# Patient Record
Sex: Male | Born: 1955 | Race: White | Hispanic: No | Marital: Married | Smoking: Never smoker
Health system: Southern US, Community
[De-identification: ages and names within clinical notes are randomized; demographics above are authoritative.]

## PROBLEM LIST (undated history)

## (undated) HISTORY — PX: NO PAST SURGERIES: SHX2092

---

## 2006-01-29 ENCOUNTER — Ambulatory Visit: Payer: Self-pay | Admitting: Family Medicine

## 2006-05-20 ENCOUNTER — Ambulatory Visit: Payer: Self-pay | Admitting: Family Medicine

## 2006-05-20 DIAGNOSIS — I10 Essential (primary) hypertension: Secondary | ICD-10-CM

## 2006-05-20 DIAGNOSIS — R000 Tachycardia, unspecified: Secondary | ICD-10-CM | POA: Insufficient documentation

## 2006-05-22 ENCOUNTER — Telehealth (INDEPENDENT_AMBULATORY_CARE_PROVIDER_SITE_OTHER): Payer: Self-pay | Admitting: *Deleted

## 2006-06-08 ENCOUNTER — Encounter: Payer: Self-pay | Admitting: Family Medicine

## 2006-06-11 ENCOUNTER — Ambulatory Visit: Payer: Self-pay | Admitting: Family Medicine

## 2006-06-11 ENCOUNTER — Telehealth: Payer: Self-pay | Admitting: Family Medicine

## 2006-06-12 ENCOUNTER — Encounter: Payer: Self-pay | Admitting: Family Medicine

## 2006-06-12 ENCOUNTER — Telehealth (INDEPENDENT_AMBULATORY_CARE_PROVIDER_SITE_OTHER): Payer: Self-pay | Admitting: *Deleted

## 2006-06-12 LAB — CONVERTED CEMR LAB
ALT: 51 units/L (ref 0–53)
AST: 48 units/L — ABNORMAL HIGH (ref 0–37)
Albumin: 4.3 g/dL (ref 3.5–5.2)
Alkaline Phosphatase: 74 units/L (ref 39–117)
Potassium: 4.4 meq/L (ref 3.5–5.3)
Sodium: 139 meq/L (ref 135–145)
Total Protein: 7.1 g/dL (ref 6.0–8.3)
Uric Acid, Serum: 7.3 mg/dL — ABNORMAL HIGH (ref 2.4–7.0)

## 2006-06-15 LAB — CONVERTED CEMR LAB

## 2006-06-22 ENCOUNTER — Telehealth: Payer: Self-pay | Admitting: Family Medicine

## 2006-06-25 ENCOUNTER — Encounter: Payer: Self-pay | Admitting: Family Medicine

## 2006-12-22 ENCOUNTER — Ambulatory Visit: Payer: Self-pay | Admitting: Family Medicine

## 2006-12-22 DIAGNOSIS — M109 Gout, unspecified: Secondary | ICD-10-CM

## 2006-12-23 ENCOUNTER — Encounter: Payer: Self-pay | Admitting: Family Medicine

## 2006-12-28 ENCOUNTER — Telehealth: Payer: Self-pay | Admitting: Family Medicine

## 2006-12-30 LAB — CONVERTED CEMR LAB
Calcium: 10 mg/dL (ref 8.4–10.5)
Direct LDL: 154 mg/dL — ABNORMAL HIGH
Sodium: 140 meq/L (ref 135–145)

## 2007-03-17 ENCOUNTER — Ambulatory Visit: Payer: Self-pay | Admitting: Cardiology

## 2007-09-29 ENCOUNTER — Ambulatory Visit: Payer: Self-pay | Admitting: Family Medicine

## 2007-09-29 DIAGNOSIS — M25429 Effusion, unspecified elbow: Secondary | ICD-10-CM

## 2007-09-29 DIAGNOSIS — M7020 Olecranon bursitis, unspecified elbow: Secondary | ICD-10-CM

## 2007-11-08 ENCOUNTER — Ambulatory Visit: Payer: Self-pay | Admitting: Family Medicine

## 2007-11-08 DIAGNOSIS — IMO0002 Reserved for concepts with insufficient information to code with codable children: Secondary | ICD-10-CM

## 2007-11-09 ENCOUNTER — Encounter: Payer: Self-pay | Admitting: Family Medicine

## 2008-10-18 ENCOUNTER — Ambulatory Visit: Payer: Self-pay | Admitting: Family Medicine

## 2008-11-07 ENCOUNTER — Telehealth (INDEPENDENT_AMBULATORY_CARE_PROVIDER_SITE_OTHER): Payer: Self-pay | Admitting: *Deleted

## 2008-11-16 ENCOUNTER — Ambulatory Visit: Payer: Self-pay | Admitting: Family Medicine

## 2009-01-15 ENCOUNTER — Ambulatory Visit: Payer: Self-pay | Admitting: Family Medicine

## 2009-01-15 DIAGNOSIS — L02619 Cutaneous abscess of unspecified foot: Secondary | ICD-10-CM

## 2009-01-15 DIAGNOSIS — L03119 Cellulitis of unspecified part of limb: Secondary | ICD-10-CM

## 2009-01-16 ENCOUNTER — Encounter: Payer: Self-pay | Admitting: Family Medicine

## 2009-01-16 ENCOUNTER — Telehealth (INDEPENDENT_AMBULATORY_CARE_PROVIDER_SITE_OTHER): Payer: Self-pay | Admitting: *Deleted

## 2009-01-16 ENCOUNTER — Telehealth: Payer: Self-pay | Admitting: Family Medicine

## 2009-01-16 LAB — CONVERTED CEMR LAB
Basophils Relative: 0 % (ref 0–1)
Lymphs Abs: 1.4 10*3/uL (ref 0.7–4.0)
MCHC: 32.5 g/dL (ref 30.0–36.0)
Monocytes Relative: 7 % (ref 3–12)
Neutro Abs: 7.3 10*3/uL (ref 1.7–7.7)
Neutrophils Relative %: 78 % — ABNORMAL HIGH (ref 43–77)
Platelets: 282 10*3/uL (ref 150–400)
RBC: 4.44 M/uL (ref 4.22–5.81)
Uric Acid, Serum: 6.1 mg/dL (ref 4.0–7.8)
WBC: 9.5 10*3/uL (ref 4.0–10.5)

## 2009-01-19 ENCOUNTER — Encounter: Payer: Self-pay | Admitting: Family Medicine

## 2009-03-07 ENCOUNTER — Telehealth: Payer: Self-pay | Admitting: Family Medicine

## 2009-05-07 ENCOUNTER — Telehealth: Payer: Self-pay | Admitting: Family Medicine

## 2009-05-07 ENCOUNTER — Ambulatory Visit: Payer: Self-pay | Admitting: Family Medicine

## 2009-05-08 ENCOUNTER — Telehealth: Payer: Self-pay | Admitting: Family Medicine

## 2009-05-08 ENCOUNTER — Encounter: Payer: Self-pay | Admitting: Family Medicine

## 2009-05-08 LAB — CONVERTED CEMR LAB
Basophils Absolute: 0.1 10*3/uL (ref 0.0–0.1)
Basophils Relative: 1 % (ref 0–1)
Hemoglobin: 14.9 g/dL (ref 13.0–17.0)
Lymphocytes Relative: 24 % (ref 12–46)
Monocytes Absolute: 0.5 10*3/uL (ref 0.1–1.0)
Neutro Abs: 5 10*3/uL (ref 1.7–7.7)
Neutrophils Relative %: 65 % (ref 43–77)
Platelets: 234 10*3/uL (ref 150–400)
RDW: 13 % (ref 11.5–15.5)
Sed Rate: 8 mm/hr (ref 0–16)
Uric Acid, Serum: 8 mg/dL — ABNORMAL HIGH (ref 4.0–7.8)

## 2009-07-16 ENCOUNTER — Encounter: Admission: RE | Admit: 2009-07-16 | Discharge: 2009-07-16 | Payer: Self-pay | Admitting: Family Medicine

## 2009-07-16 ENCOUNTER — Ambulatory Visit: Payer: Self-pay | Admitting: Family Medicine

## 2009-07-16 DIAGNOSIS — M25469 Effusion, unspecified knee: Secondary | ICD-10-CM | POA: Insufficient documentation

## 2009-07-16 DIAGNOSIS — M25569 Pain in unspecified knee: Secondary | ICD-10-CM

## 2009-07-17 LAB — CONVERTED CEMR LAB
Basophils Absolute: 0 10*3/uL (ref 0.0–0.1)
Basophils Relative: 0 % (ref 0–1)
Lymphocytes Relative: 18 % (ref 12–46)
MCHC: 33.6 g/dL (ref 30.0–36.0)
Neutro Abs: 5 10*3/uL (ref 1.7–7.7)
Neutrophils Relative %: 73 % (ref 43–77)
Platelets: 236 10*3/uL (ref 150–400)
RDW: 12.9 % (ref 11.5–15.5)
Sed Rate: 19 mm/hr — ABNORMAL HIGH (ref 0–16)
Uric Acid, Serum: 6.4 mg/dL (ref 4.0–7.8)

## 2009-07-25 ENCOUNTER — Ambulatory Visit: Payer: Self-pay | Admitting: Family Medicine

## 2009-08-14 ENCOUNTER — Ambulatory Visit: Payer: Self-pay | Admitting: Family Medicine

## 2009-08-15 LAB — CONVERTED CEMR LAB
BUN: 15 mg/dL (ref 6–23)
Basophils Absolute: 0.1 10*3/uL (ref 0.0–0.1)
Basophils Relative: 1 % (ref 0–1)
Calcium: 9.1 mg/dL (ref 8.4–10.5)
Eosinophils Relative: 1 % (ref 0–5)
Glucose, Bld: 100 mg/dL — ABNORMAL HIGH (ref 70–99)
Hemoglobin: 14.5 g/dL (ref 13.0–17.0)
Lymphocytes Relative: 21 % (ref 12–46)
MCHC: 33.6 g/dL (ref 30.0–36.0)
Monocytes Absolute: 0.7 10*3/uL (ref 0.1–1.0)
Neutro Abs: 5.2 10*3/uL (ref 1.7–7.7)
Platelets: 278 10*3/uL (ref 150–400)
Potassium: 4.2 meq/L (ref 3.5–5.3)
RDW: 12.3 % (ref 11.5–15.5)
Sodium: 135 meq/L (ref 135–145)
Uric Acid, Serum: 6.3 mg/dL (ref 4.0–7.8)

## 2009-08-16 ENCOUNTER — Telehealth: Payer: Self-pay | Admitting: Family Medicine

## 2009-08-24 ENCOUNTER — Telehealth (INDEPENDENT_AMBULATORY_CARE_PROVIDER_SITE_OTHER): Payer: Self-pay | Admitting: *Deleted

## 2010-04-13 ENCOUNTER — Ambulatory Visit: Payer: Self-pay | Admitting: Emergency Medicine

## 2010-04-13 DIAGNOSIS — J029 Acute pharyngitis, unspecified: Secondary | ICD-10-CM | POA: Insufficient documentation

## 2010-04-13 LAB — CONVERTED CEMR LAB: Rapid Strep: NEGATIVE

## 2010-07-23 NOTE — Letter (Signed)
Summary: CONTROLLED MED RX POLICY  CONTROLLED MED RX POLICY   Imported By: Dannette Barbara 04/14/2010 15:05:07  _____________________________________________________________________  External Attachment:    Type:   Image     Comment:   External Document

## 2010-07-23 NOTE — Progress Notes (Signed)
Summary: Pain  Phone Note Call from Patient   Caller: Patient Summary of Call: Pt's daughter Marko Plume)  [facility] asking for a return call from you to discuss Pt's foot. I called Brooke back to tell her that she was not listed and we were not able to release information to her. She stated that Pt was very frustrated bc he feels that he has wasted so much time in our office and other offices and has not gotten any relief or answers. Nehemiah Settle said that Pt's foot was much worse today and Pt could not walk. I advised her to have Pt go to ED tonight. She agreed and was on her way to Pt's house.  Initial call taken by: Payton Spark CMA,  August 16, 2009 4:47 PM

## 2010-07-23 NOTE — Assessment & Plan Note (Signed)
Summary: foot pain/ gout   Vital Signs:  Patient profile:   55 year old male Height:      68.5 inches Weight:      211 pounds BMI:     31.73 O2 Sat:      97 % on Room air Temp:     98.4 degrees F oral Pulse rate:   82 / minute BP sitting:   154 / 94  (left arm) Cuff size:   large  Vitals Entered By: Payton Spark CMA (August 14, 2009 2:33 PM)  O2 Flow:  Room air CC: L foot swollen and painful x 4 days. Does not think it is gout   Primary Care Provider:  Seymour Bars D.O.  CC:  L foot swollen and painful x 4 days. Does not think it is gout.  History of Present Illness: 55 yo WM presents for bilat foot pain/ redness and L sided swelling x 4 days.  Hx of severe gout, requiring hospitalization for cellulitis in 2010.  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  He is currently on Uloric + Indomethacin for his gout.  He is due to have his uric acid checked.  His pain has increased and involves the L leg up to the knee with swelling.  He has been adding more Advil to his current regimen.    He is not on BP meds. Denies fevers.    Allergies: No Known Drug Allergies  Past History:  Past Medical History: Reviewed history from 09/29/2007 and no changes required. HTN gout  Social History: Reviewed history from 09/29/2007 and no changes required. 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  Has 2 grown sons.  Nonsmoker.  Has 2 beers per day.  Runs 5 miles 6 days a wk. Lifts weights and golfs.  Review of Systems      See HPI  Physical Exam  General:  alert, well-developed, well-nourished, and well-hydrated.   Lungs:  Normal respiratory effort, chest expands symmetrically. Lungs are clear to auscultation, no crackles or wheezes. Heart:  Normal rate and regular rhythm. S1 and S2 normal without gallop, murmur, click, rub or other extra sounds. Msk:  hallux rigiditus both great toes with MTP synovitis. Injection of the  entire L foot w/ no pinpoint tenderness.  full ankle ROM.   Pulses:  2+ bilat radial pulses Extremities:  L>R LE edema Neurologic:  sensation intact to light touch.   antalgic gait   Impression & Recommendations:  Problem # 1:  CELLULITIS AND ABSCESS OF FOOT EXCEPT TOES (ICD-682.7) Will go ahead and put him on Doxy to cover for MRSA given hx of similar acute gout that resulted in fevers and redness.  His pain and redness is worse than his usual flare ups. Call if redness and pain have not improved in 10 days.  Consider TEE to r/o cardiac valve vegetations. The following medications were removed from the medication list:    Cephalexin 500 Mg Tabs (Cephalexin) .Marland Kitchen... 1 tab by mouth three times a day x 7 days His updated medication list for this problem includes:    Doxycycline Monohydrate 100 Mg Caps (Doxycycline monohydrate) .Marland Kitchen... 1 capsule by mouth two times a day x 10 days, take with food  Orders: T-CBC w/Diff ([id number])  Problem # 2:  GOUT NOS (ICD-274.9) Continue current meds.  Check uric acid level today. His updated medication list for this problem includes:    Indomethacin 50 Mg  Caps (Indomethacin) .Marland Kitchen... 1 capsule by mouth three times a day with food    Uloric 40 Mg Tabs (Febuxostat) .Marland Kitchen... 1 tab by mouth daily  Orders: T-Uric Acid (Blood) ([id number])  Problem # 3:  HYPERTENSION, BENIGN ESSENTIAL (ICD-401.1) BP remains high.  Start on Enalapril daily.  F/U in 6 wks. His updated medication list for this problem includes:    Enalapril Maleate 5 Mg Tabs (Enalapril maleate) .Marland Kitchen... 1 tab by mouth daily  Orders: T-Basic Metabolic Panel ([id number])  BP today: 154/94 Prior BP: 182/103 (07/16/2009)  Labs Reviewed: K+: 4.9 (12/22/2006) Creat: : 0.92 (12/22/2006)   Chol: 232 (06/11/2006)   HDL: 41 (06/11/2006)   LDL: See Comment mg/dL (95/18/8416)   TG: 606 (06/11/2006)  Complete Medication List: 1)  Indomethacin 50 Mg Caps (Indomethacin) .Marland Kitchen.. 1 capsule by mouth three  times a day with food 2)  Uloric 40 Mg Tabs (Febuxostat) .Marland Kitchen.. 1 tab by mouth daily 3)  Percocet 5-325 Mg Tabs (Oxycodone-acetaminophen) .Marland Kitchen.. 1-2 tabs by mouth q hs as needed severe pain 4)  Doxycycline Monohydrate 100 Mg Caps (Doxycycline monohydrate) .Marland Kitchen.. 1 capsule by mouth two times a day x 10 days, take with food 5)  Enalapril Maleate 5 Mg Tabs (Enalapril maleate) .Marland Kitchen.. 1 tab by mouth daily  Patient Instructions: 1)  Start Doxycyline today. 2)  Take for 10 days, with food. 3)  Stay on Uloric + Indomethacin.  Do not combine with Advil. 4)  Start Enalapril once daily for high BP -- it has remained high every visit and it's time to treat. 5)  Call if redness/ swelling / pain has not improved in 10 days and will refer to podiatry if not improving. 6)  Labs today. 7)  Will call you w/ results tomorrow. Prescriptions: ENALAPRIL MALEATE 5 MG TABS (ENALAPRIL MALEATE) 1 tab by mouth daily  #30 x 2   Entered and Authorized by:   Seymour Bars DO   Signed by:   Seymour Bars DO on 08/14/2009   Method used:   [facility] to        [facility] [phone]* (retail)       [address] [place], [place]  [address]       Ph: [phone] or [phone]       Fax: [phone]   RxID:   [id number] ULORIC 40 MG TABS (FEBUXOSTAT) 1 tab by mouth daily  #30 x 0   Entered and Authorized by:   Seymour Bars DO   Signed by:   Seymour Bars DO on 08/14/2009   Method used:   [facility] to        [facility] [phone]* (retail)       [address] [place], [place]  [address]       Ph: [phone] or [phone]       Fax: [phone]   RxID:   [id number] INDOMETHACIN 50 MG CAPS (INDOMETHACIN) 1 capsule by mouth three times a day with food  #90 x 2   Entered and Authorized by:   Seymour Bars DO   Signed by:   Seymour Bars DO on 08/14/2009   Method used:   [facility] to        [facility] [phone]* (retail)       [address] [place], [place]  [address]       Ph: [phone]  or [phone]       Fax: [phone]   RxID:   [id number] DOXYCYCLINE MONOHYDRATE 100 MG CAPS (DOXYCYCLINE MONOHYDRATE) 1 capsule by mouth two times a day x 10 days, take with food  #20 x 0   Entered and Authorized by:   Seymour Bars DO   Signed by:   Seymour Bars DO on 08/14/2009   Method used:   [facility] to        [facility] [phone]* (retail)       [address] [place], [place]  [address]       Ph: [phone] or [phone]       Fax: [phone]   RxID:   [id number]

## 2010-07-23 NOTE — Progress Notes (Signed)
Summary: Requests call  Phone Note Call from Patient   Caller: Patient [phone] Summary of Call: Pt [facility] requesting call for advise on foot problem. Do you want me to call Pt or would you like to call? Pt can be reached at [phone] Initial call taken by: Payton Spark CMA,  August 24, 2009 11:49 AM  Follow-up for Phone Call        call pt to find out what his ? is. Follow-up by: Seymour Bars DO,  August 24, 2009 11:56 AM  Additional Follow-up for Phone Call Additional follow up Details #1::        Pt asked for records and I agreed to leave at front desk. Additional Follow-up by: Payton Spark CMA,  August 24, 2009 1:39 PM

## 2010-07-23 NOTE — Assessment & Plan Note (Signed)
Summary: gouty flare   Vital Signs:  Patient profile:   55 year old male Height:      68.5 inches Weight:      214 pounds BMI:     32.18 O2 Sat:      99 % on Room air Temp:     97.8 degrees F oral Pulse rate:   88 / minute BP sitting:   182 / 103  (left arm) Cuff size:   large  Vitals Entered By: Payton Spark CMA (July 16, 2009 10:08 AM)  O2 Flow:  Room air CC: R knee pain x 1 week.    Primary Care Provider:  Seymour Bars D.O.  CC:  R knee pain x 1 week. Marland Kitchen  History of Present Illness: 55 yo WM presents for R leg pain and swelling mostly localized to the R knee.  He is taking Ibuprofen 600 mg three times a day which is helping some.  Having a hard time sleeping at night.  No fevers.   He has not run in 5 days.    He has a hx of severe gout, requiring hospitalization for foot flare up last year.  He has been taking Ibuprofen or Indocin.  He is on Allopurinol but his last Uric Acid level was 8.     Current Medications (verified): 1)  Indomethacin 50 Mg Caps (Indomethacin) .Marland Kitchen.. 1 Capsule By Mouth Three Times A Day With Food 2)  Allopurinol 100 Mg Tabs (Allopurinol) .Marland Kitchen.. 1 Tab By Mouth Daily 3)  Percocet 5-325 Mg Tabs (Oxycodone-Acetaminophen) .Marland Kitchen.. 1-2 Tabs By Mouth Q 6 Hrs As Needed Severe Pain  Allergies (verified): No Known Drug Allergies  Past History:  Past Medical History: Reviewed history from 09/29/2007 and no changes required. HTN gout  Social History: Reviewed history from 09/29/2007 and no changes required. 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  Nonsmoker.  Has 2 beers per day.  Runs 5 miles 6 days a wk. Lifts weights and golfs.  Review of Systems      See HPI  Physical Exam  General:  alert, well-developed, well-nourished, and well-hydrated.   Head:  normocephalic and atraumatic.   Mouth:  good dentition and pharynx pink and moist.   Neck:  no masses.   Lungs:  Normal respiratory effort, chest expands  symmetrically. Lungs are clear to auscultation, no crackles or wheezes. Heart:  Normal rate and regular rhythm. S1 and S2 normal without gallop, murmur, click, rub or other extra sounds. Msk:  R knee effusion with active flexion to 90 deg and full extension.  tender with movement of the patella.  full passive hip ROM.  soft tissue edema overlying the calf and thigh.  No palpable cords or venous thrombosis present. Pulses:  2+ popliteal pulse (R) Extremities:  R>L LE edema Skin:  sclera non icteric injection and heat overlying the R knee Cervical Nodes:  No lymphadenopathy noted Psych:  good eye contact, not anxious appearing, and not depressed appearing.     Impression & Recommendations:  Problem # 1:  JOINT EFFUSION, KNEE (ICD-719.06) Assessment New Xray today to look for joint degeneration.  No hx of trauma.  Hx of severe gout.  Concern last time for septic joint with fevers.  Check CBC to look for elevated WBC count.  Consider aspiration with ortho.  Cover with Keflex and treatment of his gout. Orders: T-CBC w/Diff ([id number])  Problem # 2:  GOUT NOS (ICD-274.9) Last uric  acid level 8.  He wants to try Uloric instead of Allopurinol.  R knee symptoms likely to be gout.  Stay on NSAID daily.  Check Uric Acid and SED rate today.   His updated medication list for this problem includes:    Indomethacin 50 Mg Caps (Indomethacin) .Marland Kitchen... 1 capsule by mouth three times a day with food    Uloric 40 Mg Tabs (Febuxostat) .Marland Kitchen... 1 tab by mouth daily  Orders: T-Uric Acid (Blood) ([id number]) T-Sed Rate (Automated) ([id number])  Problem # 3:  HYPERTENSION, BENIGN ESSENTIAL (ICD-401.1) BP high today, in pain.  Will update labs are f/u visit.  Will treat his pain and see if his BP normalizes by next wk. BP today: 182/103 Prior BP: 150/94 (05/07/2009)  Labs Reviewed: K+: 4.9 (12/22/2006) Creat: : 0.92 (12/22/2006)   Chol: 232 (06/11/2006)   HDL: 41 (06/11/2006)   LDL: See Comment mg/dL  (16/06/930)   TG: 355 (06/11/2006)  Complete Medication List: 1)  Indomethacin 50 Mg Caps (Indomethacin) .Marland Kitchen.. 1 capsule by mouth three times a day with food 2)  Uloric 40 Mg Tabs (Febuxostat) .Marland Kitchen.. 1 tab by mouth daily 3)  Percocet 5-325 Mg Tabs (Oxycodone-acetaminophen) .Marland Kitchen.. 1-2 tabs by mouth q hs as needed severe pain 4)  Cephalexin 500 Mg Tabs (Cephalexin) .Marland Kitchen.. 1 tab by mouth three times a day x 7 days  Other Orders: T-DG Knee 2 Views*R* ([id number])  Patient Instructions: 1)  Xray R knee today. 2)  Labs today. 3)  Will call you w/ results tomorrow. 4)  Change Allopurinol to Uloric once daily for gout. 5)  Stay on Indomethacin 2-3 x a day with food.   6)  Use Percocet at night as needed. 7)  Empirically start Cephalexin (antibiotic). 8)  REturn to recheck leg in 1 wk. Prescriptions: CEPHALEXIN 500 MG TABS (CEPHALEXIN) 1 tab by mouth three times a day x 7 days  #21 x 0   Entered and Authorized by:   Seymour Bars DO   Signed by:   Seymour Bars DO on 07/16/2009   Method used:   [facility] to        [facility] [phone]* (retail)       [address] [place], [place]  [address]       Ph: [phone] or [phone]       Fax: [phone]   RxID:   [id number] PERCOCET 5-325 MG TABS (OXYCODONE-ACETAMINOPHEN) 1-2 tabs by mouth q hs as needed severe pain  #30 x 0   Entered and Authorized by:   Seymour Bars DO   Signed by:   Seymour Bars DO on 07/16/2009   Method used:   Print then Give to Patient   RxID:   [id number] INDOMETHACIN 50 MG CAPS (INDOMETHACIN) 1 capsule by mouth three times a day with food  #90 x 1   Entered and Authorized by:   Seymour Bars DO   Signed by:   Seymour Bars DO on 07/16/2009   Method used:   [facility]ronically to        [facility] [phone]* (retail)       [address] [place], [place]  [address]       Ph: [phone] or [phone]       Fax: [phone]   RxID:   [id number]

## 2010-07-23 NOTE — Assessment & Plan Note (Signed)
Summary: Cough-dry, chest congestion, sorethroat x 3 dys rm 4   Vital Signs:  Patient Profile:   55 Years Old Male CC:      Cold & URI symptoms Height:     68.5 inches Weight:      211 pounds O2 Sat:      98 % O2 treatment:    Room Air Temp:     97.8 degrees F oral Pulse rate:   82 / minute Pulse rhythm:   regular Resp:     16 per minute BP sitting:   171 / 109  (left arm) Cuff size:   regular  Vitals Entered By: Areta Haber CMA (April 13, 2010 5:38 PM)                  Current Allergies: No known allergies History of Present Illness Chief Complaint: Cold & URI symptoms History of Present Illness: Patient complains of onset of cold symptoms for 4 days.  They have been using Dayquil, Nyquil, Advil, and Zinc which is helping a little bit.  His daughter was visiting last week and was diagnosed and treated for strep throat.   + sore throat + dry cough No pleuritic pain No wheezing No nasal congestion No post-nasal drainage No sinus pain/pressure No itchy/red eyes No earache No hemoptysis No SOB + chills/sweats No fever No nausea No vomiting No abdominal pain No diarrhea No skin rashes No fatigue No myalgias No headache   Current Problems: ACUTE PHARYNGITIS (ICD-462) JOINT EFFUSION, KNEE (ICD-719.06) KNEE PAIN, RIGHT (ICD-719.46) CELLULITIS AND ABSCESS OF FOOT EXCEPT TOES (ICD-682.7) CELLULITIS AND ABSCESS OF UNSPECIFIED DIGIT (ICD-681.9) OLECRANON BURSITIS, RIGHT (ICD-726.33) EFFUSION OF UPPER ARM JOINT (ICD-719.02) GOUT NOS (ICD-274.9) SCREENING FOR MALIGNANT NEOPLASM, PROSTATE (ICD-V76.44) HYPERTENSION, BENIGN ESSENTIAL (ICD-401.1) TACHYCARDIA (ICD-785.0)   Current Meds INDOMETHACIN 50 MG CAPS (INDOMETHACIN) 1 capsule by mouth three times a day with food ULORIC 80 MG TABS (FEBUXOSTAT) 1 tab by mouth daily PERCOCET 5-325 MG TABS (OXYCODONE-ACETAMINOPHEN) 1-2 tabs by mouth q hs as needed severe pain DOXYCYCLINE MONOHYDRATE 100 MG CAPS  (DOXYCYCLINE MONOHYDRATE) 1 capsule by mouth two times a day x 10 days, take with food ENALAPRIL MALEATE 5 MG TABS (ENALAPRIL MALEATE) 1 tab by mouth daily DAYQUIL MULTI-SYMPTOM 30-325-10 MG/15ML LIQD (PSEUDOEPHEDRINE-APAP-DM) as  directed VICKS NYQUIL MULTI-SYMPTOM 15-6.25-500 MG/15ML LIQD (DM-DOXYLAMINE-ACETAMINOPHEN) as directed AMOXICILLIN 875 MG TABS (AMOXICILLIN) 1 tab by mouth two times a day for 7 days CHERATUSSIN AC 100-10 MG/5ML SYRP (GUAIFENESIN-CODEINE) 5cc by mouth every 6 hours as needed for cough  REVIEW OF SYSTEMS Constitutional Symptoms      Denies fever, chills, night sweats, weight loss, weight gain, and fatigue.  Eyes       Denies change in vision, eye pain, eye discharge, glasses, contact lenses, and eye surgery. Ear/Nose/Throat/Mouth       Complains of sore throat and hoarseness.      Denies hearing loss/aids, change in hearing, ear pain, ear discharge, dizziness, frequent runny nose, frequent nose bleeds, sinus problems, and tooth pain or bleeding.      Comments: x 3 dys Respiratory       Complains of dry cough.      Denies productive cough, wheezing, shortness of breath, asthma, bronchitis, and emphysema/COPD.      Comments: chest congestion Cardiovascular       Denies murmurs, chest pain, and tires easily with exhertion.    Gastrointestinal       Denies stomach pain, nausea/vomiting, diarrhea, constipation, blood in bowel movements, and  indigestion. Genitourniary       Denies painful urination, kidney stones, and loss of urinary control. Neurological       Denies paralysis, seizures, and fainting/blackouts. Musculoskeletal       Denies muscle pain, joint pain, joint stiffness, decreased range of motion, redness, swelling, muscle weakness, and gout.  Skin       Denies bruising, unusual mles/lumps or sores, and hair/skin or nail changes.  Psych       Denies mood changes, temper/anger issues, anxiety/stress, speech problems, depression, and sleep problems. Other  Comments: Pt has not seen his PCP for this.   Past History:  Past Medical History: Last updated: 09/29/2007 HTN gout  Past Surgical History: Last updated: 03/31/2006 _  Family History: Last updated: 03/31/2006 _  Social History: Last updated: 09/29/2007 Sr account Production designer, theatre/television/film for [employer].  Married to [place].  Has 2 grown sons.  Nonsmoker.  Has 2 beers per day.  Runs 5 miles 6 days a wk. Lifts weights and golfs.  Risk Factors: Exercise: yes (05/20/2006)  Risk Factors: Smoking Status: never (05/20/2006) Physical Exam General appearance: well developed, well nourished, no acute distress Ears: normal, no lesions or deformities Nasal: mucosa pink, nonedematous, no septal deviation, turbinates normal Oral/Pharynx: pharyngeal erythema without exudate, uvula midline without deviation Chest/Lungs: no rales, wheezes, or rhonchi bilateral, breath sounds equal without effort Heart: regular rate and  rhythm, no murmur Skin: no obvious rashes or lesions MSE: oriented to time, place, and person Assessment New Problems: ACUTE PHARYNGITIS (ICD-462)   Patient Education: Patient and/or caregiver instructed in the following: rest, fluids, Ibuprofen prn. change toothbrush in 1-2 days  Plan New Medications/Changes: CHERATUSSIN AC 100-10 MG/5ML SYRP (GUAIFENESIN-CODEINE) 5cc by mouth every 6 hours as needed for cough  #6oz x 0, 04/13/2010, Hoyt Koch MD AMOXICILLIN 875 MG TABS (AMOXICILLIN) 1 tab by mouth two times a day for 7 days  #14 x 0, 04/13/2010, Hoyt Koch MD  New Orders: New Patient Level III [95621] Rapid Strep [30865] Planning Comments:   1)  Take the prescribed antibiotic as instructed. 2)  Use nasal saline solution (over the counter) at least 3 times a day. 3)  Can take tylenol every 6 hours or motrin every 8 hours for pain or fever. 4)  Follow up with your primary doctor  if no improvement in 5-7 days, sooner if increasing pain, fever, or new  symptoms.  5) Please follow up with your PCP to discuss your blood pressure   The patient and/or caregiver has been counseled thoroughly with regard to medications prescribed including dosage, schedule, interactions, rationale for use, and possible side effects and they verbalize understanding.  Diagnoses and expected course of recovery discussed and will return if not improved as expected or if the condition worsens. Patient and/or caregiver verbalized understanding.  Prescriptions: CHERATUSSIN AC 100-10 MG/5ML SYRP (GUAIFENESIN-CODEINE) 5cc by mouth every 6 hours as needed for cough  #6oz x 0   Entered and Authorized by:   Hoyt Koch MD   Signed by:   Hoyt Koch MD on 04/13/2010   Method used:   Print then Give to Patient   RxID:   [id number] AMOXICILLIN 875 MG TABS (AMOXICILLIN) 1 tab by mouth two times a day for 7 days  #14 x 0   Entered and Authorized by:   Hoyt Koch MD   Signed by:   Hoyt Koch MD on 04/13/2010   Method used:   Print then Give to Patient   RxID:  [id number]   Orders Added: 1)  New Patient Level III [99203] 2)  Rapid Strep [78469]    Laboratory Results  Date/Time Received: April 13, 2010 6:01 PM  Date/Time Reported: April 13, 2010 6:01 PM   Other Tests  Rapid Strep: negative  Kit Test Internal QC: Negative   (Normal Range: Negative)

## 2010-08-13 ENCOUNTER — Encounter: Payer: Self-pay | Admitting: Family Medicine

## 2010-08-13 ENCOUNTER — Ambulatory Visit: Payer: Self-pay | Admitting: Family Medicine

## 2010-08-13 ENCOUNTER — Ambulatory Visit (INDEPENDENT_AMBULATORY_CARE_PROVIDER_SITE_OTHER): Payer: Self-pay | Admitting: Family Medicine

## 2010-08-13 DIAGNOSIS — I10 Essential (primary) hypertension: Secondary | ICD-10-CM

## 2010-08-13 DIAGNOSIS — R21 Rash and other nonspecific skin eruption: Secondary | ICD-10-CM | POA: Insufficient documentation

## 2010-08-14 ENCOUNTER — Encounter: Payer: Self-pay | Admitting: Family Medicine

## 2010-08-20 NOTE — Assessment & Plan Note (Signed)
Summary: rash/ BP   Vital Signs:  Patient profile:   55 year old male Height:      68.5 inches Weight:      206 pounds BMI:     30.98 O2 Sat:      98 % on Room air Pulse rate:   94 / minute BP sitting:   155 / 99  (left arm) Cuff size:   large  Vitals Entered By: Payton Spark CMA (August 13, 2010 10:49 AM)  O2 Flow:  Room air CC: C/o psoriasis on head x 6 weeks.   Primary Care Provider:  Seymour Bars D.O.  CC:  C/o psoriasis on head x 6 weeks.Marland Kitchen  History of Present Illness: He states he first noted a rash on his head approximately 6 weeks ago.  When it first appeared, the skin broke, it was pruritic, his scalp was flaking and it slowly started to enlarge.  He started putting 1% hydrocortisone cream on it 4 weeks ago and the flaking went away, and the skin was no longer breaking open.  About this time, he also began to develop redness on the front of his head but the skin never broke open.  He does occasionally put the cream on this part of his head if it gets very red.  Currently, he states it is not pruritic but only red.  He states this has never happened before.  Patient states he takes his blood pressure at home occasionally and its usually upper 130s/upper 80s.  He was on a blood pressure medication at one time but he did not feel like it lowered his blood pressure even though he was on it for approximately one year.  He has had no recent weight changes.  He does state that he takes approximately 6 ibuprofen per day out of habit after running but does not have any pain.  He realizes that he will eventually need to be on a blood pressure medication and is open to starting one today.  He denies chest pains, heart palpitations, dizziness, headaches or changes in vision.  In regards to his foot issues, Dr. Yates Decamp referred him to another physician who placed him on allopurinol and he states that he is doing very well and is able to run every day.  Current Medications  (verified): 1)  Colcrys 0.6 Mg Tabs (Colchicine) 2)  Allopurinol 300 Mg Tabs (Allopurinol)  Allergies (verified): No Known Drug Allergies  Past History:  Past Medical History: HTN gout-- ws  Past Surgical History: Reviewed history from 03/31/2006 and no changes required. _  Social History: Reviewed history from 09/29/2007 and no changes required. Sr Scientist, water quality for [employer].  Married to [place].  Has 2 grown sons.  Nonsmoker.  Has 2 beers per day.  Runs 5 miles 6 days a wk. Lifts weights and golfs.  Review of Systems      See HPI  Physical Exam  General:  alert, well-developed, and normal appearance.   Head:  normocephalic and atraumatic.   Eyes:  sclera non icteric Mouth:  pharynx pink and moist.   Lungs:  normal respiratory effort, normal breath sounds, no crackles, and no wheezes.   Heart:  normal rate, regular rhythm, no murmur, no gallop, and no rub.   Pulses:  R radial normal and L radial normal.   Extremities:  No LE edema noted. Skin:  Area of approximately 8-10 cm in diameter over the crown of the head that is raised slightly above the skin  and is red in color.  The patient has blotchy satellite lesions surrounding this main area as well as a similar looking rash on the frontal portion of his head.  A yellow-orange rash is also present at his scalp line on the frontal portion of his head.  There are no breaks in the skin and no scaling present at this time.   Impression & Recommendations:  Problem # 1:  SKIN RASH (ICD-782.1) Patient has a non-pruritic red rash over the crown of his head with satellite lesions as well as a similar rash on the frontal portion of his head that is continuous with a yellow-orange rash at his scalp line.  Due to the presence of satellite lesions on his head, we will send for a KOH prep to rule out fungal infection.  We will also start him on a more potent steroid, Triamcinolone for potential seborrheic dermatitis.  We also  recommend changing shampoo to Selsyn blue in case the infection is fungal but will reserve treatment with an oral antifungal until the results of the KOH prep are back.  His updated medication list for this problem includes:    Triamcinolone Acetonide 0.1 % Oint (Triamcinolone acetonide) .Marland Kitchen... Apply to rash two times a day x 10 days  Orders: T-KOH Prep Fungal ([id number])  Problem # 2:  HYPERTENSION, BENIGN ESSENTIAL (ICD-401.1) Patient's blood pressure today was 155/99 electronically and 148/98 on manual retake which are both Stage 1 Hypertension.  At home, his BP is usually upper 130s/upper80s which is pre-hypertension.  The patient's history of gout does not allow for the use of HCTZ and enalapril did not lower his blood pressure in the past.  We will address his blood pressure control when he follows up for his physical in 2 months.  He was instructed to keep taking his blood pressure at home and to call if he is constantly running >140/90.  The following medications were removed from the medication list:    Enalapril Maleate 5 Mg Tabs (Enalapril maleate) .Marland Kitchen... 1 tab by mouth daily  Problem # 3:  GOUT NOS (ICD-274.9) The patient was referred to a separate physician by Dr. Yates Decamp and that physician placed him on allopurinol.  The patient feels like he has no foot pain and is able to exercise as he pleases.  Will continue allopurinol and attempt to get notes from this physician.  The following medications were removed from the medication list:    Indomethacin 50 Mg Caps (Indomethacin) .Marland Kitchen... 1 capsule by mouth three times a day with food    Uloric 80 Mg Tabs (Febuxostat) .Marland Kitchen... 1 tab by mouth daily His updated medication list for this problem includes:    Colcrys 0.6 Mg Tabs (Colchicine)    Allopurinol 300 Mg Tabs (Allopurinol)  Complete Medication List: 1)  Colcrys 0.6 Mg Tabs (Colchicine) 2)  Allopurinol 300 Mg Tabs (Allopurinol) 3)  Triamcinolone Acetonide 0.1 % Oint (Triamcinolone  acetonide) .... Apply to rash two times a day x 10 days  Patient Instructions: 1)  Use Triamcinolone ointment 2 x a day x 10 days for scalp rash. 2)  Change Shampoo to [employer]. 3)  Update fasting labs. 4)  Continue to check home BPs and call if running >140/90. 5)  Return for a PHYSICAL in 2 mos. Prescriptions: TRIAMCINOLONE ACETONIDE 0.1 % OINT (TRIAMCINOLONE ACETONIDE) apply to rash two times a day x 10 days  #1 tube x 0   Entered and Authorized by:   Seymour Bars DO  Signed by:   Seymour Bars DO on 08/13/2010   Method used:   [facility]ron[facility] to        [phone][facility] (retail)       [address] [place], [place]  [address]       Ph: [phone] or [phone]       Fax: [phone]   RxID:   [id number]    Orders Added: 1)  T-KOH Prep Fungal [[id number]] 2)  Est. Patient Level III [36644]

## 2010-08-27 ENCOUNTER — Telehealth: Payer: Self-pay | Admitting: Family Medicine

## 2010-09-03 NOTE — Progress Notes (Signed)
Summary: Rash   Phone Note Call from Patient Call back at [phone]   Caller: Patient Summary of Call: Pt states his rash is getting worse, he feels like the cream is not working, pls contact Initial call taken by: Lannette Donath,  August 27, 2010 8:33 AM  Follow-up for Phone Call        due to the chronic nature of his rash, I will get him in with derm. Follow-up by: Seymour Bars DO,  August 27, 2010 9:33 AM

## 2010-11-05 NOTE — Assessment & Plan Note (Signed)
[facility]                            CARDIOLOGY OFFICE NOTE   NAME:Hoganson, DAGEN BEEVERS                     MRN:          [id number]  DATE:03/17/2007                            DOB:          [birth year]    CARDIOLOGY CONSULTATION   HISTORY:  Mr. Rands is a very pleasant 55 year old gentleman with  recently diagnosed hypertension who I am asked to evaluate for benign  hypertension and tachycardia.   The patient has no prior cardiac history.  Note, he is a former marine  and exercises routinely.  He runs 5 miles per day and also lifts  weights.  He denies any dyspnea on exertion, orthopnea, paroxysmal  nocturnal dyspnea, pedal edema, palpitations, presyncope, syncope or  exertional chest pain.  He does state that is resting heart rate is  somewhat elevated in the 90 range.  He also states that he was giving  blood recently and his heart rate was over 100 and he had to relax prior  to giving the blood.  He was seen by Dr. Cathey Endow and medication was  prescribed for this but apparently it was not effective.  He  discontinued this after one week.  Because of his tachycardia, we were  asked to further evaluate.   MEDICATIONS:  He is not taking any medications at present, other than  ibuprofen as needed.   ALLERGIES:  He has no known drug allergies.   SOCIAL HISTORY:  He does not smoke.  He consumes 6 to 8 beers per day.   FAMILY HISTORY:  His family history is negative for coronary artery  disease.   PAST MEDICAL HISTORY:  Significant for recently diagnosed hypertension.  There is no diabetes mellitus or hyperlipidemia.  He has had no previous  surgeries.  There are no other medical problems.   REVIEW OF SYSTEMS:  He denies any headaches, fevers or chills.  There is  no productive cough or hemoptysis.  There is no dysphagia, odynophagia,  melena.  He has had hematochezia that he has attributed to lifting  weights in the past.  He has not had this  evaluated.  There is no  dysuria or hematuria.  There is no rash or seizure activity.  There is  no orthopnea, PND or pedal edema.  He occasionally has gout.  The  remainder of the systems are negative.   PHYSICAL EXAMINATION:  VITAL SIGNS:  His physical examination today  shows a blood pressure of 134/87, his pulse is 77.  GENERAL APPEARANCE:  He is well-developed, well-nourished, in no acute  distress.  He does not appear depressed.  SKIN:  Warm and dry.  MUSCULOSKELETAL:  His back is normal.  HEENT:  Normal with normal eyelids.  NECK:  Supple with normal carotid upstroke bilaterally and I cannot  appreciate bruits.  There is no jugular venous distention and no  thyromegaly is noted.  CHEST:  Clear to auscultation with normal expansion.  CARDIOVASCULAR:  Exam reveals a regular rate and rhythm with normal S1  and S2.  There are no murmurs, rubs or gallops noted.  Note:  He does  have some tattoos over his upper extremities.  ABDOMEN:  Nontender, nondistended.  Positive bowel sounds.  No  hepatosplenomegaly.  No masses appreciated.  There is no abdominal  bruit.  EXTREMITIES:  There is no peripheral clubbing.  He has 2+ femoral pulses  bilaterally.  No bruits.  Extremities show no edema, clubbing, no cords.  He has 2+ posterior tibial pulses bilaterally.  NEUROLOGICAL:  Exam is grossly intact.   CLINICAL DATA:  His electrocardiogram shows a sinus rhythm at a rate of  77.  The axis is normal.  There are no ST changes noted.  His intervals  are normal.   DIAGNOSES AND PLANS:  1. Asymptomatic elevated heart rate/tachycardia.  Mr. Dimalanta heart      rate has been minimally elevated previously but today it is 70.  He      has absolutely no symptoms with this including no palpitations, no      presyncope, no chest pain and no shortness of breath.  We will make      sure that he has had a recent hemoglobin and hematocrit to exclude      anemia as the cause.  We will also make sure  that he has had a      recent TSH to exclude hyperthyroidism.  He does consume 6 to 8      beers per day.  I doubt that he has a cardiomyopathy related to      alcohol but we will schedule him to have an echocardiogram to fully      assess his left ventricular function.  I think if this work up is      unremarkable then we would not pursue further evaluation.  Note, he      runs approximately 30 miles per week with no symptoms and I,      therefore, do not think further ischemic evaluation is warranted.  2. Borderline hypertension.  His diastolic blood pressure is minimally      elevated today but he is taking no medications.  He has an      excellent lifestyle including exercise and diet.  However, reducing      his alcohol intake would help with his blood pressure and we      discussed this today.  If it remains elevated then a low dose ACE      inhibitor or calcium blocker may be beneficial.  I will leave this      to Dr. Cathey Endow.  3. Alcohol.  We discussed the importance of decreasing this.  4. I will see him back on an as-needed basis pending the results of      his labs and his echocardiogram.     Madolyn Frieze. Jens Som, MD, [facility]  Electronically Signed    BSC/MedQ  DD: 03/17/2007  DT: 03/18/2007  Job #: [id number]   cc:   Seymour Bars, D.O.

## 2010-11-13 ENCOUNTER — Telehealth: Payer: Self-pay | Admitting: Family Medicine

## 2010-11-13 MED ORDER — ALLOPURINOL 300 MG PO TABS: 300.0000 mg | ORAL_TABLET | Freq: Every day | ORAL | Status: DC

## 2010-11-13 NOTE — Telephone Encounter (Signed)
Rx faxed [facility]

## 2010-11-13 NOTE — Telephone Encounter (Signed)
Pt called after 4pm on 11-13-10 and call returned 11-14-10 early am.  Pt requesting allopurinol 300mg  for not gout treatment.  Only saw specialist for this one time.  I see the allopurinol 300mg  in centricity, but need dosing instructions.  Pt would like for Dr. Cathey Endow to start prescribing gout medications.  Even though not gout diagnosis he said specialist treated as such.  Please advise if we can send this med and dosing instructions needed.  ? 300mg  PO daily. Plan:  Routed to Dr. Arlice Colt, LPN [facility]  .

## 2010-11-13 NOTE — Telephone Encounter (Signed)
I RFd this but I don't have a pharmacy listed, so RX printed.

## 2010-11-14 ENCOUNTER — Other Ambulatory Visit: Payer: Self-pay | Admitting: Family Medicine

## 2010-11-14 NOTE — Telephone Encounter (Signed)
Called pt and he was wanting a refill, and he said someone had already taken care of the refill. Randy Newcomer, LPN [facility]

## 2011-02-27 ENCOUNTER — Other Ambulatory Visit: Payer: Self-pay | Admitting: Family Medicine

## 2011-05-27 ENCOUNTER — Encounter: Payer: Self-pay | Admitting: *Deleted

## 2011-05-27 ENCOUNTER — Emergency Department
Admission: EM | Admit: 2011-05-27 | Discharge: 2011-05-27 | Disposition: A | Discharge status: Home / Self Care | Payer: BC Managed Care – PPO | Source: Home / Self Care | Attending: Family Medicine | Admitting: Family Medicine

## 2011-05-27 DIAGNOSIS — R6889 Other general symptoms and signs: Secondary | ICD-10-CM

## 2011-05-27 MED ORDER — BENZONATATE 200 MG PO CAPS: 200.0000 mg | ORAL_CAPSULE | Freq: Every day | ORAL | Status: AC

## 2011-05-27 MED ORDER — AZITHROMYCIN 250 MG PO TABS: ORAL_TABLET | ORAL | Status: AC

## 2011-05-27 NOTE — ED Provider Notes (Signed)
History     CSN: [id number] Arrival date & time: 05/27/2011 10:43 AM   First MD Initiated Contact with Patient 05/27/11 1128      Chief Complaint  Patient presents with  . Sore Throat  . Generalized Body Aches     HPI Comments: Patient complains of approximately 4 day history of gradually progressive URI symptoms beginning with a mild sore throat (now worse), followed by progressive nasal congestion.  A cough started at the same time.  Complains of fatigue and initial myalgias.  Cough is now worse at night and generally non-productive during the day.  There has been no pleuritic pain, shortness of breath, or wheezes.  He has not had a flu shot.  Patient is a 55 y.o. male presenting with pharyngitis.  Sore Throat    Past Medical History  Diagnosis Date  . Gout     History reviewed. No pertinent past surgical history.  Family History  Problem Relation Age of Onset  . Diabetes Father     History  Substance Use Topics  . Smoking status: Never Smoker   . Smokeless tobacco: Not on file  . Alcohol Use: Yes     "few beers"      Review of Systems + sore throat + cough No pleuritic pain No wheezing + nasal congestion + post-nasal drainage No sinus pain/pressure No itchy/red eyes No earache No hemoptysis No SOB + fever/chills No nausea No vomiting No abdominal pain No diarrhea No urinary symptoms No skin rashes + fatigue + myalgias No headache Used OTC meds without relief  Allergies  Review of patient's allergies indicates no known allergies.  Home Medications   Current Outpatient Rx  Name Route Sig Dispense Refill  . ALLOPURINOL 300 MG PO TABS Oral Take 1 tablet (300 mg total) by mouth daily. 30 tablet 6  . AZITHROMYCIN 250 MG PO TABS  Take 2 tabs today; then begin one tab once daily for 4 more days.   DEA # [id number] 6 each 0  . BENZONATATE 200 MG PO CAPS Oral Take 1 capsule (200 mg total) by mouth at bedtime. Take as needed for cough 12 capsule 0    . COLCRYS 0.6 MG PO TABS  TAKE 1 TABLET TWICE A DAY FOR 3 DAYS, THEN TAKE 1 TABLET DAILY 60 tablet 4    BP 161/101  Pulse 108  Temp(Src) 99.4 F (37.4 C) (Oral)  Resp 14  Ht 5\' 9"  (1.753 m)  Wt 194 lb 12.8 oz (88.361 kg)  BMI 28.77 kg/m2  SpO2 98%  Physical Exam Nursing notes and Vital Signs reviewed. Appearance:  Patient appears healthy, stated age, and in no acute distress  Eyes:  Pupils are equal, round, and reactive to light and accomodation.  Extraocular movement is intact.  Conjunctivae are not inflamed  Ears:  Canals normal.  Tympanic membranes normal.  Nose:  Mildly congested turbinates.  No sinus tenderness.   Pharynx:  Erythematous Neck:  Supple.  No adenopathy is present.  Lungs:  Clear to auscultation.  Breath sounds are equal.  Heart:  Regular rate and rhythm without murmurs, rubs, or gallops.  Abdomen:  Nontender without masses or hepatosplenomegaly.  Bowel sounds are present.  No CVA or flank tenderness.  Extremities:  No edema.   Skin:  No rash present.    ED Course  Procedures  none   Labs Reviewed  POCT RAPID STREP A (OFFICE) negative      1. Influenza-like illness  MDM  Suspect influenza; however Tamiflu would be less effective at this late date. Begin Azithromycin.  Begin expectorant/decongestant, topical decongestant, saline nasal spray and/or saline irrigation, and cough suppressant at bedtime.  Avoid antihistamines for now. Increase fluid intake, rest. Followup with PCP if not improving 7 to 10 days.         Donna Christen, MD 05/28/11 [phone]

## 2011-05-27 NOTE — ED Notes (Signed)
Patient c/o productive cough, body aches, sore throat and fatigue x 4 days. Taken Mucinex and Day/nyquil.

## 2011-06-10 ENCOUNTER — Other Ambulatory Visit: Payer: Self-pay | Admitting: *Deleted

## 2011-06-10 MED ORDER — ALLOPURINOL 300 MG PO TABS: 300.0000 mg | ORAL_TABLET | Freq: Every day | ORAL | Status: DC

## 2011-11-20 ENCOUNTER — Other Ambulatory Visit: Payer: Self-pay | Admitting: Family Medicine

## 2011-12-21 ENCOUNTER — Other Ambulatory Visit: Payer: Self-pay | Admitting: Family Medicine

## 2011-12-23 ENCOUNTER — Ambulatory Visit (INDEPENDENT_AMBULATORY_CARE_PROVIDER_SITE_OTHER): Payer: BC Managed Care – PPO | Admitting: Family Medicine

## 2011-12-23 ENCOUNTER — Encounter: Payer: Self-pay | Admitting: Family Medicine

## 2011-12-23 VITALS — BP 148/90 | HR 89 | Temp 98.2°F | Ht 69.0 in | Wt 200.0 lb

## 2011-12-23 DIAGNOSIS — M109 Gout, unspecified: Secondary | ICD-10-CM

## 2011-12-23 DIAGNOSIS — L723 Sebaceous cyst: Secondary | ICD-10-CM

## 2011-12-23 DIAGNOSIS — Z13220 Encounter for screening for lipoid disorders: Secondary | ICD-10-CM

## 2011-12-23 DIAGNOSIS — R21 Rash and other nonspecific skin eruption: Secondary | ICD-10-CM

## 2011-12-23 DIAGNOSIS — Z1211 Encounter for screening for malignant neoplasm of colon: Secondary | ICD-10-CM

## 2011-12-23 MED ORDER — COLCHICINE 0.6 MG PO TABS: 0.6000 mg | ORAL_TABLET | Freq: Every day | ORAL | Status: DC

## 2011-12-23 MED ORDER — ALLOPURINOL 300 MG PO TABS: 300.0000 mg | ORAL_TABLET | Freq: Every day | ORAL | Status: DC

## 2011-12-23 NOTE — Patient Instructions (Signed)

## 2011-12-23 NOTE — Progress Notes (Signed)
  Subjective:    Patient ID: Randy Conley, male    DOB: [birth year], 56 y.o.   MRN: [id number]  HPI Gout-Discussed options. He has been very well-controlled. He has been on allopurinol 300 mg and colchicine once a day for 2 years. He has not had a single flare during this timeframe. He denies any GI upset or bowel problems with it. He does think he recently has developed hemorrhoids.. He has never had a colonoscopy.  Cyst on his neck. Has been there for years but has been slowly getting larger. No painful or tender.     Review of Systems     Objective:   Physical Exam  Constitutional: He appears well-developed and well-nourished.  Musculoskeletal:       No swelling or erythema.    Skin: Skin is warm and dry.       Oval-shaped sebaceous cyst on the left side of the neck. Approximately 1 cm. Nontender. No erythema. No active drainage.  Psychiatric: He has a normal mood and affect. His behavior is normal.          Assessment & Plan:  Gout- he is well controlled right now. Discussed he can stop the colchicine. Typically we only continued allopurinol with colchicine for the first 6 months. I do want to get an up-to-date kidney function on him. He is always had a normal uric acid so we will not repeat this today. His him that he continues to colchicine only when he has a flare but he continued allopurinol every day.  Colon cancer screening-he has never had a colonoscopy. We discussed the need for this. We will make a referral. Patient prefers [place] location.  Sebaceous cyst-given handout with information. We discussed procedure on how to remove the cyst. Patient will schedule in the next couple weeks. I did send him that this is a benign condition.

## 2012-01-01 ENCOUNTER — Encounter: Payer: Self-pay | Admitting: Family Medicine

## 2012-01-01 ENCOUNTER — Ambulatory Visit (INDEPENDENT_AMBULATORY_CARE_PROVIDER_SITE_OTHER): Payer: BC Managed Care – PPO | Admitting: Family Medicine

## 2012-01-01 DIAGNOSIS — L723 Sebaceous cyst: Secondary | ICD-10-CM

## 2012-01-01 NOTE — Patient Instructions (Addendum)
Schedule follow up for suture removal in 7 days.  Call if any sign of infection  Sutured Wound Care Sutures are stitches that can be used to close wounds. Wound care helps prevent pain and infection.   HOME CARE INSTRUCTIONS    Rest and elevate the injured area until all the pain and swelling are gone.   Only take over-the-counter or prescription medicines for pain, discomfort, or fever as directed by your caregiver.   After 48 hours, gently wash the area with mild soap and water once a day, or as directed. Rinse off the soap. Pat the area dry with a clean towel. Do not rub the wound. This may cause bleeding.   Follow your caregiver's instructions for how often to change the bandage (dressing). Stop using a dressing after 2 days or after the wound stops draining.   If the dressing sticks, moisten it with soapy water and gently remove it.   Apply ointment on the wound as directed.   Avoid stretching a sutured wound.   Drink enough fluids to keep your urine clear or pale yellow.   Follow up with your caregiver for suture removal as directed.   Use sunscreen on your wound for the next 3 to 6 months so the scar will not darken.  SEEK IMMEDIATE MEDICAL CARE IF:    Your wound becomes red, swollen, hot, or tender.   You have increasing pain in the wound.   You have a red streak that extends from the wound.   There is pus coming from the wound.   You have a fever.   You have shaking chills.   There is a bad smell coming from the wound.   You have persistent bleeding from the wound.  MAKE SURE YOU:    Understand these instructions.   Will watch your condition.   Will get help right away if you are not doing well or get worse.  Document Released: 07/17/2004 Document Revised: 05/29/2011 Document Reviewed: 10/13/2010 [facility] Patient Information 2012 [place], [place].

## 2012-01-01 NOTE — Progress Notes (Signed)
  Subjective:    Patient ID: Randy Conley, male    DOB: Dec 24, 1955, 56 y.o.   MRN: [id number]  HPI  Her for sebaceous cyst removal on the left side of the neck.    Review of Systems     Objective:   Physical Exam        Assessment & Plan:  Sebaceous Cyst Excision Procedure Note  Pre-operative Diagnosis: sebaceous cyst.   Post-operative Diagnosis: same  Locations:left neck  Indications: irritated  Anesthesia: Lidocaine 1% with epinephrine without added sodium bicarbonate  Procedure Details  History of allergy to iodine: no  Patient informed of the risks (including bleeding and infection) and benefits of the  procedure and Verbal informed consent obtained.  The lesion and surrounding area was given a sterile prep using betadyne and draped in the usual sterile fashion. An incision was made over the cyst, which was dissected free of the surrounding tissue and removed.  The cyst was filled with typical sebaceous material.  The wound was closed with 3-0 ethilon using simple interrupted stitches (1 suture placed). Antibiotic ointment and a sterile dressing applied.  The specimen was not sent for pathologic examination. The patient tolerated the procedure well.  EBL: 0 ml  Findings: Seb cyst  Condition: Stable  Complications: none.  Plan: 1. Instructed to keep the wound dry and covered for 24-48h and clean thereafter. 2. Warning signs of infection were reviewed.   3. Recommended that the patient use OTC acetaminophen as needed for pain.  4. Return for suture removal in 7 days.

## 2012-01-03 ENCOUNTER — Other Ambulatory Visit: Payer: Self-pay | Admitting: Family Medicine

## 2012-01-08 ENCOUNTER — Ambulatory Visit (INDEPENDENT_AMBULATORY_CARE_PROVIDER_SITE_OTHER): Payer: BC Managed Care – PPO | Admitting: Family Medicine

## 2012-01-08 ENCOUNTER — Ambulatory Visit: Payer: BC Managed Care – PPO | Admitting: Family Medicine

## 2012-01-08 VITALS — BP 140/82 | HR 60 | Wt 200.0 lb

## 2012-01-08 DIAGNOSIS — Z4802 Encounter for removal of sutures: Secondary | ICD-10-CM

## 2012-01-08 NOTE — Progress Notes (Signed)
  Subjective:    Patient ID: Randy Conley, male    DOB: 31-Dec-1955, 56 y.o.   MRN: [id number]  HPI  Patient presents for suture removal. The wound is well healed without signs of infection.  The sutures are removed. Wound care and activity instructions given. Return prn.   Review of Systems     Objective:   Physical Exam        Assessment & Plan:

## 2012-04-16 ENCOUNTER — Other Ambulatory Visit: Payer: Self-pay | Admitting: *Deleted

## 2012-04-16 MED ORDER — ALLOPURINOL 300 MG PO TABS: 300.0000 mg | ORAL_TABLET | Freq: Every day | ORAL | Status: DC

## 2012-05-04 ENCOUNTER — Other Ambulatory Visit: Payer: Self-pay | Admitting: *Deleted

## 2012-05-04 MED ORDER — ALLOPURINOL 300 MG PO TABS: 300.0000 mg | ORAL_TABLET | Freq: Every day | ORAL | Status: DC

## 2012-06-08 ENCOUNTER — Encounter: Payer: Self-pay | Admitting: *Deleted

## 2012-06-29 ENCOUNTER — Encounter: Payer: Self-pay | Admitting: Family Medicine

## 2012-09-23 ENCOUNTER — Other Ambulatory Visit: Payer: Self-pay | Admitting: Family Medicine

## 2013-01-20 ENCOUNTER — Other Ambulatory Visit: Payer: Self-pay | Admitting: Family Medicine

## 2013-02-15 ENCOUNTER — Other Ambulatory Visit: Payer: Self-pay | Admitting: *Deleted

## 2013-02-25 ENCOUNTER — Encounter: Payer: Self-pay | Admitting: Family Medicine

## 2013-02-25 ENCOUNTER — Ambulatory Visit (INDEPENDENT_AMBULATORY_CARE_PROVIDER_SITE_OTHER): Payer: BC Managed Care – PPO | Admitting: Family Medicine

## 2013-02-25 ENCOUNTER — Telehealth: Payer: Self-pay | Admitting: Family Medicine

## 2013-02-25 VITALS — BP 135/79 | HR 92 | Wt 200.0 lb

## 2013-02-25 DIAGNOSIS — Z13220 Encounter for screening for lipoid disorders: Secondary | ICD-10-CM

## 2013-02-25 DIAGNOSIS — L309 Dermatitis, unspecified: Secondary | ICD-10-CM

## 2013-02-25 DIAGNOSIS — M109 Gout, unspecified: Secondary | ICD-10-CM

## 2013-02-25 DIAGNOSIS — L259 Unspecified contact dermatitis, unspecified cause: Secondary | ICD-10-CM

## 2013-02-25 MED ORDER — CLOBETASOL PROPIONATE 0.05 % EX CREA: TOPICAL_CREAM | Freq: Every day | CUTANEOUS | Status: DC | PRN

## 2013-02-25 MED ORDER — ALLOPURINOL 300 MG PO TABS: ORAL_TABLET | ORAL | Status: DC

## 2013-02-25 NOTE — Telephone Encounter (Signed)
Informed pt he MUST make an appointment before any refills. He has not been seen since July 2013. Pt transferred to schedule appt.  Meyer Cory, LPN

## 2013-02-25 NOTE — Telephone Encounter (Signed)
Wife states per [facility] that patient needs script for Alpurinol renewed.  He is down to two pills.  Please call husband on his cell phone if needed

## 2013-02-25 NOTE — Progress Notes (Signed)
  Subjective:    Patient ID: Randy Conley, male    DOB: 10/04/1955, 57 y.o.   MRN: [id number]  HPI Gout - he has some assessment of allopurinol. His last uric acid level was in 2011. He feels like overall his gout is fairly well controlled he has not had any major issues.  He has not had any gout flares in the last year. He does have some deformity at his DIPs and he says it actually has gotten a little bit better. He exercises regularly. He takes his albuterol every day. He would like assistance prescriptions to mail order.  He also would like a prescription for clobetasol. He said he was given at a couple years ago for the occasional splotchy red rash. He says typically he can just use it for one to 2 days and the rash resolves. He would like to have her refill today.   Review of Systems     Objective:   Physical Exam  Constitutional: He is oriented to person, place, and time. He appears well-developed and well-nourished.  HENT:  Head: Normocephalic and atraumatic.  Cardiovascular: Normal rate, regular rhythm and normal heart sounds.   Pulmonary/Chest: Effort normal and breath sounds normal.  Musculoskeletal:  He does have some mild deformity at the DIPs on both hands. Otherwise no erythema or swelling today.  Neurological: He is alert and oriented to person, place, and time.  Skin: Skin is warm and dry.  Psychiatric: He has a normal mood and affect. His behavior is normal.          Assessment & Plan:  Gout - currently well controlled on current regimen. I did encourage him to decrease the amount of red meat that he see him. We'll recheck his uric acid and kidney function today. At some point would like him to go to have his cholesterol checked as well. I gave him a lab slip for that as well.  Eczema-refill sent for clobetasol cream. Warned him not to use it daily for more than 2 weeks at a time because of potential for side effects.  Declined flu vaccine today.

## 2013-02-26 LAB — COMPLETE METABOLIC PANEL WITH GFR
Alkaline Phosphatase: 76 U/L (ref 39–117)
BUN: 15 mg/dL (ref 6–23)
CO2: 24 mEq/L (ref 19–32)
Creat: 1.03 mg/dL (ref 0.50–1.35)
GFR, Est African American: >89 mL/min
GFR, Est Non African American: 80 mL/min
Glucose, Bld: 80 mg/dL (ref 70–99)
Total Bilirubin: 0.4 mg/dL (ref 0.3–1.2)

## 2013-03-28 ENCOUNTER — Other Ambulatory Visit: Payer: Self-pay | Admitting: Family Medicine

## 2013-03-28 NOTE — Telephone Encounter (Signed)
Please call pt. He is suing way too much clobetasol if he has already used 60 grams in one month.  He is to use a thin amout the the affected area.  Not to apply like lotion.  We will refill but he needs to slow down on [place]. Also not to use for more than 2 weeks without taking a 1 week break from the med.

## 2014-01-13 ENCOUNTER — Other Ambulatory Visit: Payer: Self-pay | Admitting: Family Medicine

## 2014-02-16 ENCOUNTER — Telehealth: Payer: Self-pay | Admitting: Family Medicine

## 2014-02-16 MED ORDER — ALLOPURINOL 300 MG PO TABS: ORAL_TABLET | ORAL | Status: DC

## 2014-02-16 NOTE — Telephone Encounter (Signed)
Patient is out of his prescription and needs a refill of allopurinol. He only has 5 tablets left and [facility] [address] [facility]. Patient states he is going out of town and if he needs an appointment he will schedule when he returns. Thanks

## 2014-02-16 NOTE — Telephone Encounter (Signed)
rx sent.Randy Conley  

## 2014-03-21 ENCOUNTER — Ambulatory Visit (INDEPENDENT_AMBULATORY_CARE_PROVIDER_SITE_OTHER): Payer: BC Managed Care – PPO | Admitting: Family Medicine

## 2014-03-21 ENCOUNTER — Encounter: Payer: Self-pay | Admitting: Family Medicine

## 2014-03-21 VITALS — BP 138/96 | HR 79 | Ht 69.0 in | Wt 194.0 lb

## 2014-03-21 DIAGNOSIS — Z13220 Encounter for screening for lipoid disorders: Secondary | ICD-10-CM

## 2014-03-21 DIAGNOSIS — L259 Unspecified contact dermatitis, unspecified cause: Secondary | ICD-10-CM | POA: Diagnosis not present

## 2014-03-21 DIAGNOSIS — M109 Gout, unspecified: Secondary | ICD-10-CM | POA: Diagnosis not present

## 2014-03-21 DIAGNOSIS — L309 Dermatitis, unspecified: Secondary | ICD-10-CM

## 2014-03-21 DIAGNOSIS — IMO0001 Reserved for inherently not codable concepts without codable children: Secondary | ICD-10-CM

## 2014-03-21 DIAGNOSIS — Z125 Encounter for screening for malignant neoplasm of prostate: Secondary | ICD-10-CM | POA: Diagnosis not present

## 2014-03-21 DIAGNOSIS — R030 Elevated blood-pressure reading, without diagnosis of hypertension: Secondary | ICD-10-CM

## 2014-03-21 MED ORDER — ALLOPURINOL 300 MG PO TABS: ORAL_TABLET | ORAL | Status: DC

## 2014-03-21 MED ORDER — CLOBETASOL PROPIONATE 0.05 % EX CREA: TOPICAL_CREAM | Freq: Every day | CUTANEOUS | Status: DC | PRN

## 2014-03-21 NOTE — Progress Notes (Signed)
   Subjective:    Patient ID: Randy Conley, male    DOB: Apr 11, 1956, 58 y.o.   MRN: [id number]  HPI One-year followup for gout. He has been taking allopurinol regularly. He has not had any flares in the last year. His last uric acid level looks fantastic about a year ago. He still exercises regularly.  Eczema-does need a refill on a topical steroid cream. He says he uses it very sparingly. He still has some left but it's getting old.  Review of Systems     Objective:   Physical Exam  Constitutional: He is oriented to person, place, and time. He appears well-developed and well-nourished.  HENT:  Head: Normocephalic and atraumatic.  Cardiovascular: Normal rate, regular rhythm and normal heart sounds.   Pulmonary/Chest: Effort normal and breath sounds normal.  Neurological: He is alert and oriented to person, place, and time.  Skin: Skin is warm and dry.  Psychiatric: He has a normal mood and affect. His behavior is normal.          Assessment & Plan:  Gout-well-controlled. Continue current regimen. We'll recheck uric acid level. Due for screening lipid panel and CMP. Followup one year.  Elevated blood pressure-recommend followup in one month to recheck blood pressure and make sure that is well-controlled. Make sure eating a low salt diet. Continue with regular exercise.  Eczema-refill topical steroid cream.

## 2014-03-21 NOTE — Patient Instructions (Signed)
Go for labs fasting.  Please fast for 8 hours. Our lab is on the first floor and is open from 8AM-5PM.  Closed for lunch from noon to 1PM.

## 2014-09-19 ENCOUNTER — Other Ambulatory Visit: Payer: Self-pay | Admitting: Family Medicine

## 2014-09-19 MED ORDER — ALLOPURINOL 300 MG PO TABS: ORAL_TABLET | ORAL | Status: DC

## 2015-04-19 ENCOUNTER — Other Ambulatory Visit: Payer: Self-pay

## 2015-04-19 MED ORDER — CLOBETASOL PROPIONATE 0.05 % EX CREA: TOPICAL_CREAM | Freq: Every day | CUTANEOUS | Status: DC | PRN

## 2015-04-19 NOTE — Progress Notes (Signed)
Pt called requesting refill of temovate. Pt has medication in chart for 2 yr use.

## 2015-09-05 ENCOUNTER — Other Ambulatory Visit: Payer: Self-pay | Admitting: Family Medicine

## 2015-09-05 ENCOUNTER — Other Ambulatory Visit: Payer: Self-pay | Admitting: *Deleted

## 2015-09-05 MED ORDER — ALLOPURINOL 300 MG PO TABS: ORAL_TABLET | ORAL | Status: DC

## 2015-09-12 ENCOUNTER — Telehealth: Payer: Self-pay | Admitting: Family Medicine

## 2015-09-12 NOTE — Telephone Encounter (Signed)
I called pt and left message to inform him that he is due for a f/u appt with Dr. Madilyn Fireman on Gout

## 2015-10-09 ENCOUNTER — Ambulatory Visit (INDEPENDENT_AMBULATORY_CARE_PROVIDER_SITE_OTHER): Payer: BLUE CROSS/BLUE SHIELD | Admitting: Family Medicine

## 2015-10-09 ENCOUNTER — Encounter: Payer: Self-pay | Admitting: Family Medicine

## 2015-10-09 VITALS — BP 139/88 | HR 77 | Wt 194.0 lb

## 2015-10-09 DIAGNOSIS — Z13220 Encounter for screening for lipoid disorders: Secondary | ICD-10-CM | POA: Diagnosis not present

## 2015-10-09 DIAGNOSIS — M109 Gout, unspecified: Secondary | ICD-10-CM

## 2015-10-09 DIAGNOSIS — Z23 Encounter for immunization: Secondary | ICD-10-CM

## 2015-10-09 MED ORDER — ALLOPURINOL 300 MG PO TABS: 300.0000 mg | ORAL_TABLET | Freq: Every day | ORAL | Status: AC

## 2015-10-09 NOTE — Progress Notes (Signed)
   Subjective:    Patient ID: Randy Conley, male    DOB: [birth year], 60 y.o.   MRN: [id number]  HPI Here for f/U on gout.  I last saw him a couple of years ago. He has not had a single gout flare since I last saw him. He takes his allopurinol daily without any side effects or problems. He still runs 5 miles per day and is participating in a marathon this weekend.   Review of Systems     Objective:   Physical Exam  Constitutional: He is oriented to person, place, and time. He appears well-developed and well-nourished.  HENT:  Head: Normocephalic and atraumatic.  Neck: Neck supple. No thyromegaly present.  Cardiovascular: Normal rate, regular rhythm and normal heart sounds.   Pulmonary/Chest: Effort normal and breath sounds normal.  Lymphadenopathy:    He has no cervical adenopathy.  Neurological: He is alert and oriented to person, place, and time.  Skin: Skin is warm and dry.  Psychiatric: He has a normal mood and affect. His behavior is normal.          Assessment & Plan:  Gout-due to recheck liver and kidney function as well as a uric acid level. Refills sent to the pharmacy today. Follow-up in one year.  T tdap updated today.

## 2015-10-12 LAB — LIPID PANEL
CHOL/HDL RATIO: 3.9 ratio (ref <=5.0)
Cholesterol: 205 mg/dL — ABNORMAL HIGH (ref 125–200)
HDL: 53 mg/dL (ref >=40)
LDL CALC: 123 mg/dL (ref <130)
Triglycerides: 143 mg/dL (ref <150)
VLDL: 29 mg/dL (ref <30)

## 2015-10-12 LAB — COMPLETE METABOLIC PANEL WITH GFR
ALBUMIN: 4.7 g/dL (ref 3.6–5.1)
ALK PHOS: 76 U/L (ref 40–115)
ALT: 25 U/L (ref 9–46)
AST: 27 U/L (ref 10–35)
BILIRUBIN TOTAL: 0.7 mg/dL (ref 0.2–1.2)
BUN: 14 mg/dL (ref 7–25)
CALCIUM: 9.7 mg/dL (ref 8.6–10.3)
CO2: 24 mmol/L (ref 20–31)
CREATININE: 0.91 mg/dL (ref 0.70–1.33)
Chloride: 103 mmol/L (ref 98–110)
GFR, Est African American: >89 mL/min (ref >=60)
GFR, Est Non African American: >89 mL/min (ref >=60)
Glucose, Bld: 101 mg/dL — ABNORMAL HIGH (ref 65–99)
Potassium: 4.4 mmol/L (ref 3.5–5.3)
Sodium: 138 mmol/L (ref 135–146)
TOTAL PROTEIN: 7 g/dL (ref 6.1–8.1)

## 2015-10-12 LAB — URIC ACID: Uric Acid, Serum: 4.1 mg/dL (ref 4.0–7.8)

## 2015-11-12 ENCOUNTER — Ambulatory Visit: Payer: BLUE CROSS/BLUE SHIELD | Admitting: Family Medicine

## 2015-11-13 ENCOUNTER — Encounter: Payer: Self-pay | Admitting: Physician Assistant

## 2015-11-13 ENCOUNTER — Ambulatory Visit (INDEPENDENT_AMBULATORY_CARE_PROVIDER_SITE_OTHER): Payer: BLUE CROSS/BLUE SHIELD | Admitting: Physician Assistant

## 2015-11-13 VITALS — BP 145/86 | HR 93 | Ht 69.0 in | Wt 188.0 lb

## 2015-11-13 DIAGNOSIS — R1013 Epigastric pain: Secondary | ICD-10-CM

## 2015-11-13 LAB — CBC WITH DIFFERENTIAL/PLATELET
BASOS ABS: 0 {cells}/uL (ref 0–200)
BASOS PCT: 0 %
EOS ABS: 120 {cells}/uL (ref 15–500)
EOS PCT: 2 %
HCT: 41.2 % (ref 38.5–50.0)
HEMOGLOBIN: 14.3 g/dL (ref 13.2–17.1)
LYMPHS ABS: 840 {cells}/uL — AB (ref 850–3900)
Lymphocytes Relative: 14 %
MCH: 32.7 pg (ref 27.0–33.0)
MCHC: 34.7 g/dL (ref 32.0–36.0)
MCV: 94.3 fL (ref 80.0–100.0)
MONO ABS: 360 {cells}/uL (ref 200–950)
MONOS PCT: 6 %
MPV: 10.4 fL (ref 7.5–12.5)
NEUTROS ABS: 4680 {cells}/uL (ref 1500–7800)
Neutrophils Relative %: 78 %
PLATELETS: 189 10*3/uL (ref 140–400)
RBC: 4.37 MIL/uL (ref 4.20–5.80)
RDW: 12.6 % (ref 11.0–15.0)
WBC: 6 10*3/uL (ref 3.8–10.8)

## 2015-11-13 LAB — COMPLETE METABOLIC PANEL WITH GFR
ALBUMIN: 5 g/dL (ref 3.6–5.1)
ALK PHOS: 86 U/L (ref 40–115)
ALT: 26 U/L (ref 9–46)
AST: 30 U/L (ref 10–35)
BILIRUBIN TOTAL: 0.5 mg/dL (ref 0.2–1.2)
BUN: 11 mg/dL (ref 7–25)
CO2: 27 mmol/L (ref 20–31)
Calcium: 10 mg/dL (ref 8.6–10.3)
Chloride: 99 mmol/L (ref 98–110)
Creat: 0.8 mg/dL (ref 0.70–1.25)
GFR, Est African American: >89 mL/min (ref >=60)
GLUCOSE: 105 mg/dL — AB (ref 65–99)
Potassium: 4.3 mmol/L (ref 3.5–5.3)
SODIUM: 136 mmol/L (ref 135–146)
TOTAL PROTEIN: 7.2 g/dL (ref 6.1–8.1)

## 2015-11-13 LAB — LIPASE: Lipase: 31 U/L (ref 7–60)

## 2015-11-13 MED ORDER — OMEPRAZOLE 40 MG PO CPDR: 40.0000 mg | DELAYED_RELEASE_CAPSULE | Freq: Every day | ORAL | Status: DC

## 2015-11-13 MED ORDER — RANITIDINE HCL 150 MG PO TABS: 150.0000 mg | ORAL_TABLET | Freq: Two times a day (BID) | ORAL | Status: DC

## 2015-11-13 NOTE — Patient Instructions (Addendum)
Gastritis, Adult Gastritis is soreness and swelling (inflammation) of the lining of the stomach. Gastritis can develop as a sudden onset (acute) or long-term (chronic) condition. If gastritis is not treated, it can lead to stomach bleeding and ulcers. CAUSES  Gastritis occurs when the stomach lining is weak or damaged. Digestive juices from the stomach then inflame the weakened stomach lining. The stomach lining may be weak or damaged due to viral or bacterial infections. One common bacterial infection is the Helicobacter pylori infection. Gastritis can also result from excessive alcohol consumption, taking certain medicines, or having too much acid in the stomach.  SYMPTOMS  In some cases, there are no symptoms. When symptoms are present, they may include:  Pain or a burning sensation in the upper abdomen.  Nausea.  Vomiting.  An uncomfortable feeling of fullness after eating. DIAGNOSIS  Your caregiver may suspect you have gastritis based on your symptoms and a physical exam. To determine the cause of your gastritis, your caregiver may perform the following:  Blood or stool tests to check for the H pylori bacterium.  Gastroscopy. A thin, flexible tube (endoscope) is passed down the esophagus and into the stomach. The endoscope has a light and camera on the end. Your caregiver uses the endoscope to view the inside of the stomach.  Taking a tissue sample (biopsy) from the stomach to examine under a microscope. TREATMENT  Depending on the cause of your gastritis, medicines may be prescribed. If you have a bacterial infection, such as an H pylori infection, antibiotics may be given. If your gastritis is caused by too much acid in the stomach, H2 blockers or antacids may be given. Your caregiver may recommend that you stop taking aspirin, ibuprofen, or other nonsteroidal anti-inflammatory drugs (NSAIDs). HOME CARE INSTRUCTIONS  Only take over-the-counter or prescription medicines as directed by  your caregiver.  If you were given antibiotic medicines, take them as directed. Finish them even if you start to feel better.  Drink enough fluids to keep your urine clear or pale yellow.  Avoid foods and drinks that make your symptoms worse, such as:  Caffeine or alcoholic drinks.  Chocolate.  Peppermint or mint flavorings.  Garlic and onions.  Spicy foods.  Citrus fruits, such as oranges, lemons, or limes.  Tomato-based foods such as sauce, chili, salsa, and pizza.  Fried and fatty foods.  Eat small, frequent meals instead of large meals. SEEK IMMEDIATE MEDICAL CARE IF:   You have black or dark red stools.  You vomit blood or material that looks like coffee grounds.  You are unable to keep fluids down.  Your abdominal pain gets worse.  You have a fever.  You do not feel better after 1 week.  You have any other questions or concerns. MAKE SURE YOU:  Understand these instructions.  Will watch your condition.  Will get help right away if you are not doing well or get worse.   This information is not intended to replace advice given to you by your health care provider. Make sure you discuss any questions you have with your health care provider.   Document Released: 06/03/2001 Document Revised: 12/09/2011 Document Reviewed: 07/23/2011 Elsevier Interactive Patient Education 2016 [place] prilosec once a day and zantac twice a day for 8 weeks. Will treat any ulcer changes that are due to any stomach inflammation. Stay away for all anti-inflammatories.

## 2015-11-13 NOTE — Progress Notes (Signed)
   Subjective:    Patient ID: Randy Conley, male    DOB: 1956-06-22, 60 y.o.   MRN: [id number]  HPI  Pt is a 60 yo male who presents to the clinic with 1-2 weeks of epigastric pressure and emptyness. Both he and his wife had similar symptoms 2 weeks ago after eating at a resturant. His wife's symptoms resolved and his has persisted. He is now having trouble sleeping at night. Radiates into mid back. Has had some constipation but no melena or hematochezia. He does not have appetite. Does not notice that foods makes better or worse. When he is active does not notice symptoms. When he is still notice the dull ache and pressure sensation. No nausea or vomiting or fever.    Review of Systems  All other systems reviewed and are negative.      Objective:   Physical Exam  Constitutional: He is oriented to person, place, and time. He appears well-developed and well-nourished.  HENT:  Head: Normocephalic and atraumatic.  Cardiovascular: Normal rate, regular rhythm and normal heart sounds.   Pulmonary/Chest: Effort normal and breath sounds normal.  Abdominal: Soft. Bowel sounds are normal. He exhibits no distension and no mass. There is tenderness. There is no rebound and no guarding.  Some tenderness over epigastric area.  No guarding or rebound. Negative for murphys sign.   Neurological: He is alert and oriented to person, place, and time.  Psychiatric: He has a normal mood and affect. His behavior is normal.          Assessment & Plan:  Epigastric abdominal pressure/discomfort- suspect gastritis vs PUD. H.pylori breath test done today. GI cocktail given. Cbc, cmp, lipase ordered. Start prilosec and zantac for 8 weeks. Follow up as needed or if symptoms worsen or not improve.

## 2015-11-14 ENCOUNTER — Telehealth: Payer: Self-pay

## 2015-11-14 ENCOUNTER — Encounter: Payer: Self-pay | Admitting: Physician Assistant

## 2015-11-14 DIAGNOSIS — R1013 Epigastric pain: Secondary | ICD-10-CM | POA: Insufficient documentation

## 2015-11-14 DIAGNOSIS — R1084 Generalized abdominal pain: Secondary | ICD-10-CM

## 2015-11-14 LAB — H. PYLORI BREATH TEST: H. pylori Breath Test: NOT DETECTED

## 2015-11-14 MED ORDER — TRAMADOL HCL 50 MG PO TABS: 50.0000 mg | ORAL_TABLET | Freq: Four times a day (QID) | ORAL | Status: DC | PRN

## 2015-11-14 NOTE — Telephone Encounter (Signed)
[place] for abdominal ultrasound.  Ok for tramadol 1-2 tablets as needed every 4-6 hours for pain #30.

## 2015-11-16 ENCOUNTER — Ambulatory Visit (INDEPENDENT_AMBULATORY_CARE_PROVIDER_SITE_OTHER): Payer: BLUE CROSS/BLUE SHIELD

## 2015-11-16 DIAGNOSIS — R1084 Generalized abdominal pain: Secondary | ICD-10-CM

## 2015-11-20 ENCOUNTER — Ambulatory Visit (INDEPENDENT_AMBULATORY_CARE_PROVIDER_SITE_OTHER): Payer: BLUE CROSS/BLUE SHIELD | Admitting: Family Medicine

## 2015-11-20 ENCOUNTER — Encounter: Payer: Self-pay | Admitting: Family Medicine

## 2015-11-20 VITALS — BP 131/77 | HR 72 | Wt 185.0 lb

## 2015-11-20 DIAGNOSIS — R1013 Epigastric pain: Secondary | ICD-10-CM

## 2015-11-20 DIAGNOSIS — M549 Dorsalgia, unspecified: Secondary | ICD-10-CM | POA: Diagnosis not present

## 2015-11-20 MED ORDER — SUCRALFATE 1 G PO TABS: 1.0000 g | ORAL_TABLET | Freq: Three times a day (TID) | ORAL | Status: DC

## 2015-11-20 NOTE — Progress Notes (Signed)
Subjective:    CC: Abd Pain  HPI:  Epigastric abdominal Pain pt was seen 1 wk ago and he stated that the medication didn't make a difference with the sxs or pain. the pain gets worse @ night he stated that his abdomen feels bloated and hard pain is 9/10 feels like pressure on a muscle/nerve it bothers him more @ night. He says it mostly starts with a bloating sensation in the epigastric area and then actually wraps around to his back and spine on both sides. It's worse on the side that he's lying on. If he changes position that it moves to the other side. He is taking the prilosec but not really helping but hasn't been taking it consistantly. + diarrhea.  Tramadol did help the beginning. Worse when he lies down.  He denies any chest pain shortest of breath or recent upper respiratory infections or illnesses. He denies any blood in the urine or stool. No vomiting. He has lost 10 pounds since he was here. He says it's mostly because of a decreased appetite not because it hurts to eat. He says he still able to exercise during the daytime it really doesn't bother him. He has been taking Aleve for his pain.   Past medical history, Surgical history, Family history not pertinant except as noted below, Social history, Allergies, and medications have been entered into the medical record, reviewed, and corrections made.   Review of Systems: No fevers, chills, night sweats, weight loss, chest pain, or shortness of breath.   Objective:    General: Well Developed, well nourished, and in no acute distress.  Neuro: Alert and oriented x3, extra-ocular muscles intact, sensation grossly intact.  HEENT: Normocephalic, atraumatic  Skin: Warm and dry, no rashes. Cardiac: Regular rate and rhythm, no murmurs rubs or gallops, no lower extremity edema.  Respiratory: Clear to auscultation bilaterally. Not using accessory muscles, speaking in full sentences.   Impression and Recommendations:   Epigastric pain - Unclear  etiology. I still think there could be a component of gastritis or gastric ulcer. He had not been taking the PPI consistently so encouraged him to get back on an increase it to twice a day. I'm also given prescription for Carafate. We'll go ahead and refer to GI for possible endoscopy since his symptoms are persistent. Also encouraged him to stop all NSAIDs and switch to Tylenol for pain relief.  BAck pain - Also denied any injury to his back that could be causing more radicular type pain. It would be unusual to be bilateral.

## 2015-11-20 NOTE — Patient Instructions (Signed)
Increase prilosec to twice a day. One in AM about 30 min before breakfast and then at bedtime.

## 2015-11-22 ENCOUNTER — Telehealth: Payer: Self-pay | Admitting: *Deleted

## 2015-11-22 DIAGNOSIS — R1084 Generalized abdominal pain: Secondary | ICD-10-CM

## 2015-11-22 MED ORDER — TRAMADOL HCL 50 MG PO TABS: 50.0000 mg | ORAL_TABLET | Freq: Four times a day (QID) | ORAL | Status: DC | PRN

## 2015-11-22 NOTE — Telephone Encounter (Signed)
Pt's wife called and stated that he is in a lot of pain and wanted to know if he can get a refill of the tramadol. She also wanted to know about the referral to GI. I told her that the referral was placed on 11/20/15. I gave her their phone # and she can call to inquire about an appointment.  Discussed med refill with Dr. Madilyn Fireman and she is ok with refill.Audelia Hives [place]

## 2015-11-23 ENCOUNTER — Telehealth: Payer: Self-pay | Admitting: *Deleted

## 2015-11-23 NOTE — Telephone Encounter (Signed)
Pt called and stated that the GI that he was referred to is not on their plan and she got him in today 11/23/15 @ 4 pm with Dr. Janus Molder at [facility]. I told her to be sure that a ROI is signed. Maryruth Eve, [place] Crocker

## 2015-11-27 ENCOUNTER — Telehealth: Payer: Self-pay

## 2015-11-27 ENCOUNTER — Encounter: Payer: Self-pay | Admitting: Family Medicine

## 2015-11-27 DIAGNOSIS — M546 Pain in thoracic spine: Secondary | ICD-10-CM

## 2015-11-27 NOTE — Telephone Encounter (Signed)
Becki called and states she would like to talk to [place]. They are ready to proceed with the MRI.

## 2015-11-29 NOTE — Telephone Encounter (Signed)
Called and spoke to pt's wife and she stated  She had asked him (Dr. Tamala Julian) what the next steps would be since the endoscopy came back ok and he had verbally stated to her that an MRI would be the next steps. I told her that when we looked at the notes from GI this was not mentioned and this is why I was calling her back about this. I told her that I would fwd to pcp for f/u.Audelia Hives [place]

## 2015-11-29 NOTE — Telephone Encounter (Signed)
Pt's wife informed she stated that he does not need any pain meds.Audelia Hives [place]

## 2015-11-29 NOTE — Telephone Encounter (Signed)
Order for thoracic MRI ordered. They should be contacting to schedule but will require authorization first. We will start that process. Would he like something stronger for pain?

## 2015-11-30 ENCOUNTER — Telehealth: Payer: Self-pay | Admitting: Family Medicine

## 2015-11-30 MED ORDER — HYDROCODONE-ACETAMINOPHEN 5-325 MG PO TABS: 1.0000 | ORAL_TABLET | Freq: Four times a day (QID) | ORAL | Status: AC | PRN

## 2015-11-30 NOTE — Telephone Encounter (Signed)
Will fwd to pcp.Randy Conley

## 2015-11-30 NOTE — Telephone Encounter (Signed)
Pt wife will pick up in UC tonight.

## 2015-11-30 NOTE — Telephone Encounter (Signed)
Patient stated current pain medication he was taking was not working and asked if there was something else you could give him.

## 2015-11-30 NOTE — Telephone Encounter (Signed)
Prescription printed for hydrocodone. Family can pick up at their convenience.

## 2015-12-03 ENCOUNTER — Telehealth: Payer: Self-pay | Admitting: Family Medicine

## 2015-12-03 ENCOUNTER — Telehealth: Payer: Self-pay | Admitting: *Deleted

## 2015-12-03 NOTE — Telephone Encounter (Signed)
Spoke w/pt's wife and informed her that it would be ok for him to take 2 of the pain tablets ONLY if his pain is so severe and don't do this for every dosing. Also advised that she try using ice for 10-15 mins where his pain is most severe and if this doesn't help try the heat. I informed her that the results of his MRI will not be back until tomorrow morning. She voiced understanding. She also wanted to know if she needed to go to the ED which one should she go to. I advised her that if he is in that much pain to get him to the closest one and we would get the notes from care everywhere.Maryruth Eve, [place] Crocker

## 2015-12-03 NOTE — Telephone Encounter (Signed)
Pt's wife came in to drop of mri disk and stated the hydrocodon is not helping his pain any and didn't know if there was anything else that can be done to help his pain or do they need to wait until you look at the MRI.  Thanks

## 2015-12-03 NOTE — Telephone Encounter (Signed)
Ok to increase hydrocodone to 2 tabs. For pain and see if works better.

## 2015-12-04 ENCOUNTER — Ambulatory Visit (INDEPENDENT_AMBULATORY_CARE_PROVIDER_SITE_OTHER): Payer: PRIVATE HEALTH INSURANCE | Admitting: Family Medicine

## 2015-12-04 ENCOUNTER — Encounter: Payer: Self-pay | Admitting: Family Medicine

## 2015-12-04 ENCOUNTER — Ambulatory Visit (INDEPENDENT_AMBULATORY_CARE_PROVIDER_SITE_OTHER): Payer: PRIVATE HEALTH INSURANCE

## 2015-12-04 ENCOUNTER — Telehealth: Payer: Self-pay

## 2015-12-04 VITALS — BP 113/83 | HR 100 | Wt 176.0 lb

## 2015-12-04 DIAGNOSIS — C787 Secondary malignant neoplasm of liver and intrahepatic bile duct: Secondary | ICD-10-CM

## 2015-12-04 LAB — COMPREHENSIVE METABOLIC PANEL
ALBUMIN: 4.9 g/dL (ref 3.6–5.1)
ALT: 50 U/L — ABNORMAL HIGH (ref 9–46)
AST: 51 U/L — ABNORMAL HIGH (ref 10–35)
Alkaline Phosphatase: 167 U/L — ABNORMAL HIGH (ref 40–115)
BILIRUBIN TOTAL: 0.6 mg/dL (ref 0.2–1.2)
BUN: 16 mg/dL (ref 7–25)
CO2: 25 mmol/L (ref 20–31)
CREATININE: 0.8 mg/dL (ref 0.70–1.25)
Calcium: 9.7 mg/dL (ref 8.6–10.3)
Chloride: 99 mmol/L (ref 98–110)
Glucose, Bld: 116 mg/dL — ABNORMAL HIGH (ref 65–99)
Potassium: 4.2 mmol/L (ref 3.5–5.3)
SODIUM: 136 mmol/L (ref 135–146)
TOTAL PROTEIN: 7.1 g/dL (ref 6.1–8.1)

## 2015-12-04 LAB — CBC
HCT: 39.1 % (ref 38.5–50.0)
HEMOGLOBIN: 13.4 g/dL (ref 13.2–17.1)
MCH: 31.5 pg (ref 27.0–33.0)
MCHC: 34.3 g/dL (ref 32.0–36.0)
MCV: 92 fL (ref 80.0–100.0)
MPV: 10.1 fL (ref 7.5–12.5)
PLATELETS: 178 10*3/uL (ref 140–400)
RBC: 4.25 MIL/uL (ref 4.20–5.80)
RDW: 12.9 % (ref 11.0–15.0)
WBC: 6.5 10*3/uL (ref 3.8–10.8)

## 2015-12-04 LAB — ALKALINE PHOSPHATASE: ALK PHOS: 167 U/L — AB (ref 40–115)

## 2015-12-04 LAB — HEPATITIS C ANTIBODY: HCV AB: NEGATIVE

## 2015-12-04 LAB — HEPATITIS B SURFACE ANTIBODY, QUANTITATIVE: HEPATITIS B-POST: 0 m[IU]/mL

## 2015-12-04 LAB — HEPATITIS B SURFACE ANTIGEN: Hepatitis B Surface Ag: NEGATIVE

## 2015-12-04 LAB — LIPASE: LIPASE: 65 U/L — AB (ref 7–60)

## 2015-12-04 MED ORDER — IOPAMIDOL (ISOVUE-300) INJECTION 61%
100.0000 mL | Freq: Once | INTRAVENOUS | Status: AC | PRN
  Administered 2015-12-04: 100 mL via INTRAVENOUS

## 2015-12-04 MED ORDER — OXYCODONE HCL 5 MG PO TABS: 5.0000 mg | ORAL_TABLET | Freq: Four times a day (QID) | ORAL | Status: AC | PRN

## 2015-12-04 NOTE — Telephone Encounter (Signed)
Seeing Dr. Georgina Snell this AM.

## 2015-12-04 NOTE — Patient Instructions (Signed)
Thank you for coming in today. Get labs and CT scan today.  Follow up later this week for further discussion and visits.  Take oxycodone for pain as needed.   If your belly pain worsens, or you have high fever, bad vomiting, blood in your stool or black tarry stool go to the Emergency Room.    Abdominal Pain, Adult Many things can cause abdominal pain. Usually, abdominal pain is not caused by a disease and will improve without treatment. It can often be observed and treated at home. Your health care provider will do a physical exam and possibly order blood tests and X-rays to help determine the seriousness of your pain. However, in many cases, more time must pass before a clear cause of the pain can be found. Before that point, your health care provider may not know if you need more testing or further treatment. HOME CARE INSTRUCTIONS Monitor your abdominal pain for any changes. The following actions may help to alleviate any discomfort you are experiencing:  Only take over-the-counter or prescription medicines as directed by your health care provider.  Do not take laxatives unless directed to do so by your health care provider.  Try a clear liquid diet (broth, tea, or water) as directed by your health care provider. Slowly move to a bland diet as tolerated. SEEK MEDICAL CARE IF:  You have unexplained abdominal pain.  You have abdominal pain associated with nausea or diarrhea.  You have pain when you urinate or have a bowel movement.  You experience abdominal pain that wakes you in the night.  You have abdominal pain that is worsened or improved by eating food.  You have abdominal pain that is worsened with eating fatty foods.  You have a fever. SEEK IMMEDIATE MEDICAL CARE IF:  Your pain does not go away within 2 hours.  You keep throwing up (vomiting).  Your pain is felt only in portions of the abdomen, such as the right side or the left lower portion of the abdomen.  You  pass bloody or black tarry stools. MAKE SURE YOU:  Understand these instructions.  Will watch your condition.  Will get help right away if you are not doing well or get worse.   This information is not intended to replace advice given to you by your health care provider. Make sure you discuss any questions you have with your health care provider.   Document Released: 03/19/2005 Document Revised: 02/28/2015 Document Reviewed: 02/16/2013 Elsevier Interactive Patient Education [employer].

## 2015-12-04 NOTE — Progress Notes (Signed)
Randy Conley is a 60 y.o. male who presents to [place]: [place] today for follow-up abdominal pain.  Abdominal pain: Patient has had several weeks of abdominal pain that has been difficult to fully evaluate and treat. Yesterday a thoracic MRI was ordered, which did not show any thoracic etiology but did show what appears to be liver metastasis. He is here today for follow-up. He notes significant pain that hydrocodone does not controlled sufficiently. He does note that he's had about a 20 pound weight loss over the last several weeks unintentionally. No fevers or chills or night sweats.   Past Medical History  Diagnosis Date  . Gout    Past Surgical History  Procedure Laterality Date  . No past surgeries     Social History  Substance Use Topics  . Smoking status: Never Smoker   . Smokeless tobacco: Not on file  . Alcohol Use: 2.0 - 2.5 oz/week    4-5 drink(s) per week     Comment: "few beers"   family history includes Diabetes in his father; Gout in his brother.  ROS as above:  Medications: Current Outpatient Prescriptions  Medication Sig Dispense Refill  . allopurinol (ZYLOPRIM) 300 MG tablet Take 1 tablet (300 mg total) by mouth daily. 90 tablet PRN  . HYDROcodone-acetaminophen (NORCO/VICODIN) 5-325 MG tablet Take 1 tablet by mouth every 6 (six) hours as needed for moderate pain. (Patient not taking: Reported on 12/04/2015) 30 tablet 0  . oxyCODONE (ROXICODONE) 5 MG immediate release tablet Take 1-2 tablets (5-10 mg total) by mouth every 6 (six) hours as needed for severe pain. 60 tablet 0   No current facility-administered medications for this visit.   No Known Allergies   Exam:  BP 113/83 mmHg  Pulse 100  Wt 176 lb (79.833 kg)  SpO2 100% Gen: Well NAD HEENT: EOMI,  MMM Lungs: Normal work of breathing. CTABL Heart: RRR no MRG Abd: NABS, Soft.  Nondistended, Nontender Exts: Brisk capillary refill, warm and well perfused.    MRI Thoracic Spine Wo Contrast6/05/2016  [facility]  [id number]   Attending MD:  Beatrice Lecher [id number]   Ordering MD:  Beatrice Lecher Date of Birth:  21-Apr-1956    Sex: M Admit Date:  12/03/2015 11:30    ###FINAL RESULT###      INDICATIONS:  COMMENTS:       PROCEDURE:  QMR 1170- MRI T-SPINE WO CONT - Dec 03 2015    [facility] Accession #: [id number] [facility] Accession #: [id number]     MRI thoracic spine without contrast:  INDICATION: Mid back pain  TECHNIQUE: Sagittal T1, T2, and STIR sequences were performed. Axial  T2-weighted sequences.   COMPARISON: None  FINDINGS: #  Vertebral bodies: Normal height and alignment. #  Marrow signal: No significant abnormality. #  Intervertebral disc: Mild degenerative disc disease. No disc  herniation. #  Spinal canal: No significant narrowing or mass. #  Spinal cord: No spinal cord compression. No abnormal signal intensity.  #  Additional findings: There appear to be multiple lesions within the  liver suggesting metastatic disease. Recommend MRI of the liver without  and with contrast for further evaluation.      IMPRESSION: 1.  Multiple liver lesions suspicious for metastatic disease. Recommend  MRI of the liver without and with contrast for further evaluation. 2.  Mild thoracic spondylosis. No herniated disc or compression fracture.     ################################################### ##########CODE CRITICAL REPORT########### ###################################################  ___________________________________________________________ Current Report Read by:  Remi Deter  [id number] on Dec 04 2015  9:17A Transcribed byJari Pigg   on Dec 04 2015  9:26A   No results found for this or any previous visit (from the past 24 hour(s)). No results found.    Assessment and Plan: 60 y.o. male with abdominal pain highly  concerning for metastatic disease.  We had a lengthy discussion with the patient and his family today.  Plan for CT scan of the chest abdomen and pelvis with contrast today along with an extensive laboratory workup. We will likely proceed with liver MRI over the weekend. Call patient with the report of the CT scan tomorrow morning.  Discussed warning signs or symptoms. Please see discharge instructions. Patient expresses understanding.  I spent 40 minutes with this patient, greater than 50% was face-to-face time counseling regarding the above diagnosis.

## 2015-12-05 LAB — PSA: PSA: 0.35 ng/mL (ref <=4.00)

## 2015-12-05 NOTE — Progress Notes (Signed)
Quick Note:  Labs show some mild liver inflammation.  Dr Barry Dienes will be able to answer more questions about where we go from here. ______

## 2015-12-06 ENCOUNTER — Telehealth: Payer: Self-pay | Admitting: *Deleted

## 2015-12-06 NOTE — Telephone Encounter (Signed)
Pt's wife called and wanted to know if the CT report has been sent to the Dr. Parks Ranger he will be seeing tomorrow. I asked who would he be seeing and she said that the dr is with [facility]. I informed her that they would be able to see his information. I printed out a copy of this for her and left this up front for her to p/u at her convenience, and also asked if there was anything that she needed. She did not need anything. Advised her to call if she did.Audelia Hives [place]

## 2015-12-07 ENCOUNTER — Ambulatory Visit: Payer: PRIVATE HEALTH INSURANCE | Admitting: Family Medicine

## 2015-12-10 ENCOUNTER — Other Ambulatory Visit (HOSPITAL_COMMUNITY): Payer: Self-pay | Admitting: General Surgery

## 2015-12-10 DIAGNOSIS — C787 Secondary malignant neoplasm of liver and intrahepatic bile duct: Secondary | ICD-10-CM

## 2015-12-13 ENCOUNTER — Ambulatory Visit (HOSPITAL_COMMUNITY): Payer: PRIVATE HEALTH INSURANCE

## 2015-12-17 ENCOUNTER — Other Ambulatory Visit: Payer: Self-pay | Admitting: Family Medicine

## 2015-12-17 ENCOUNTER — Telehealth: Payer: Self-pay

## 2015-12-17 DIAGNOSIS — C787 Secondary malignant neoplasm of liver and intrahepatic bile duct: Secondary | ICD-10-CM

## 2015-12-17 NOTE — Telephone Encounter (Signed)
Dr. Eda Paschal, MD Hematologist in [place], [facility] Address: [address] [place], [place], [place] [address] Phone: [phone]. Patient has appointment at 3:00 pm today.

## 2016-03-23 DEATH — deceased

## 2016-08-15 IMAGING — CT CT CHEST W/ CM
2 of 5 series · 12 of 36 positions shown, 15 images · IV contrast (APPLIED)
Comparison: None.

CLINICAL DATA: Liver metastases on thoracic spine MRI

EXAM:
CT CHEST, ABDOMEN, AND PELVIS WITH CONTRAST
TECHNIQUE: Multidetector CT imaging of the chest, abdomen and pelvis was
performed following the standard protocol during bolus
administration of intravenous contrast.
CONTRAST:  100mL FECBMI-QII IOPAMIDOL (FECBMI-QII) INJECTION 61%

[Series 2: cap with 2 · axial · 0.86mm/px · z∈[-663,-78]mm · 9 of 135 slices shown, 12 images]
[im 9/135  mediastinal]
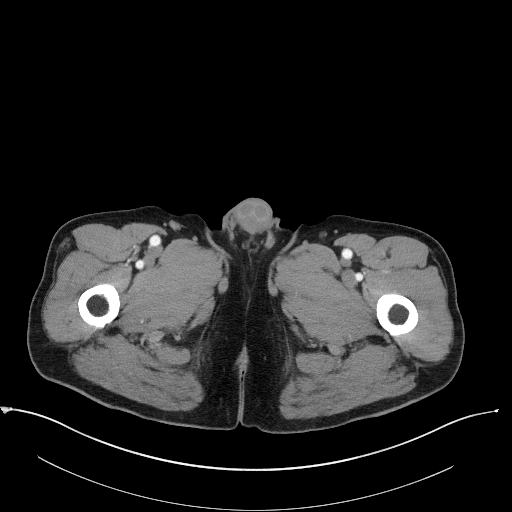
[im 9/135  lung]
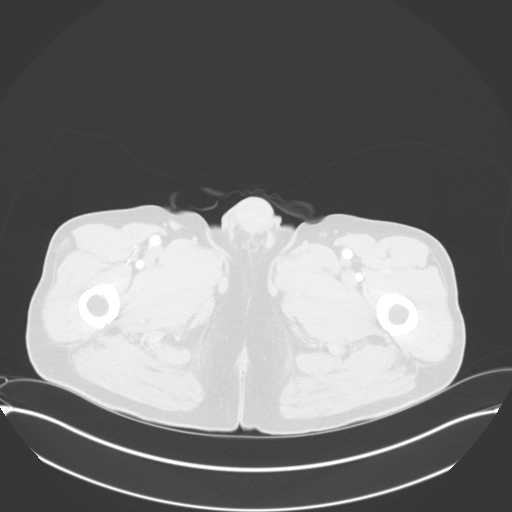
[im 26/135  lung]
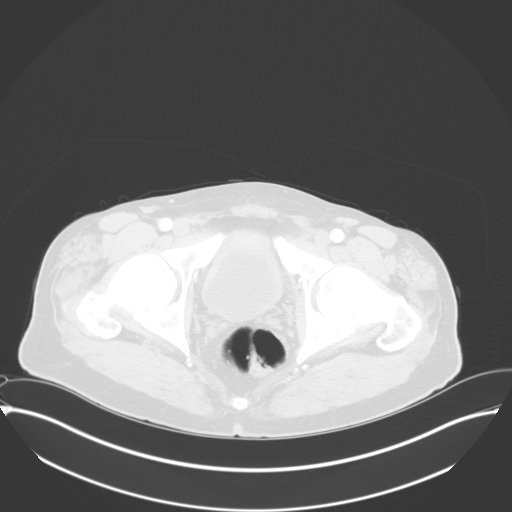
[im 42/135  lung]
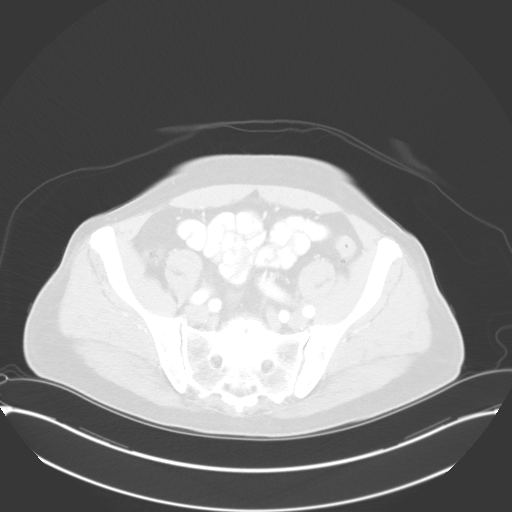
[im 51/135  lung]
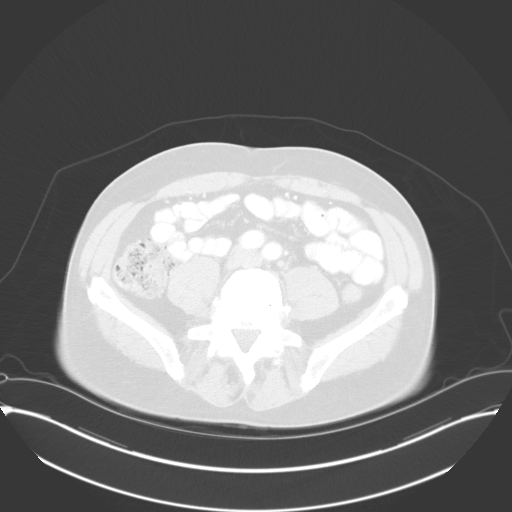
[im 68/135  mediastinal]
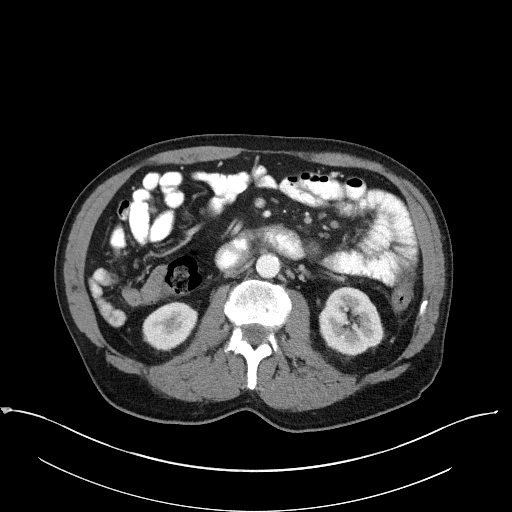
[im 68/135  lung]
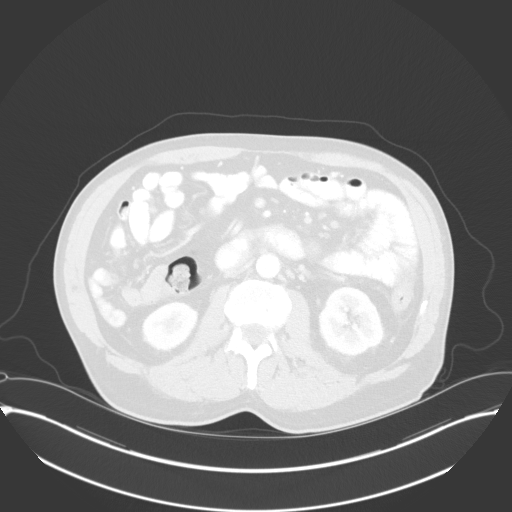
[im 84/135  lung]
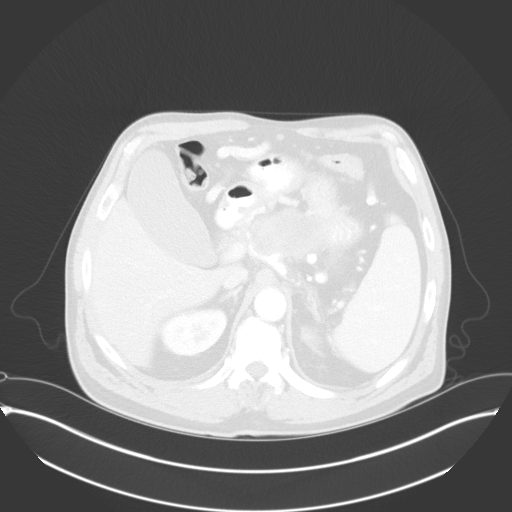
[im 93/135  lung]
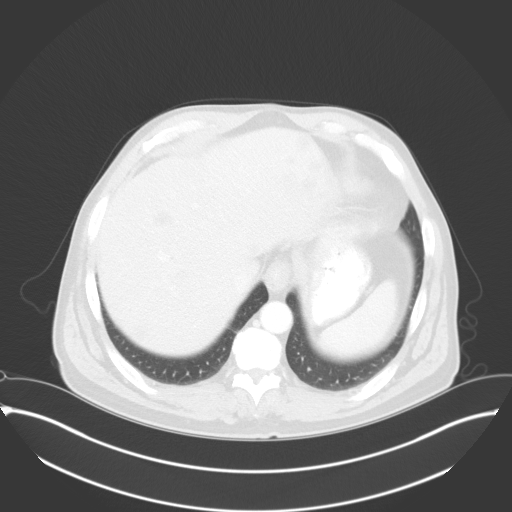
[im 109/135  lung]
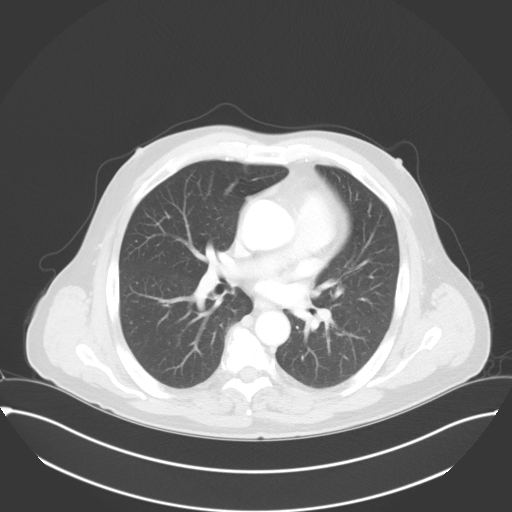
[im 126/135  mediastinal]
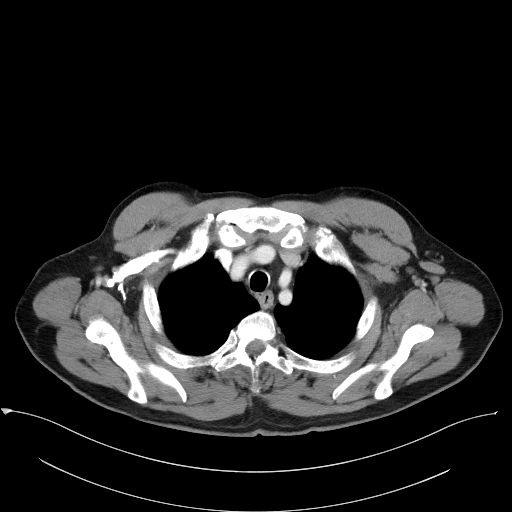
[im 126/135  lung]
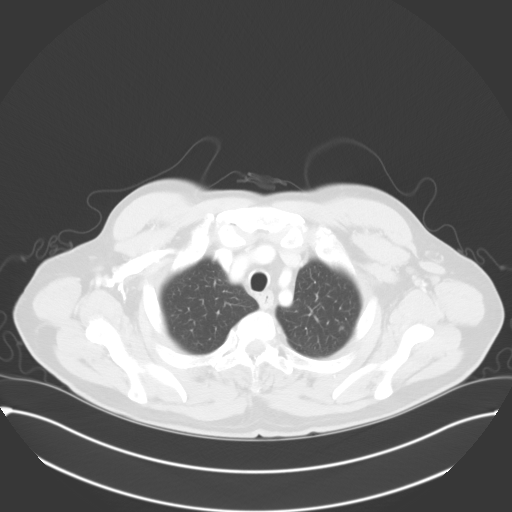

[Series 4: coronals · coronal · 0.71mm/px · 3 of 144 slices shown]
[im 29/144  lung]
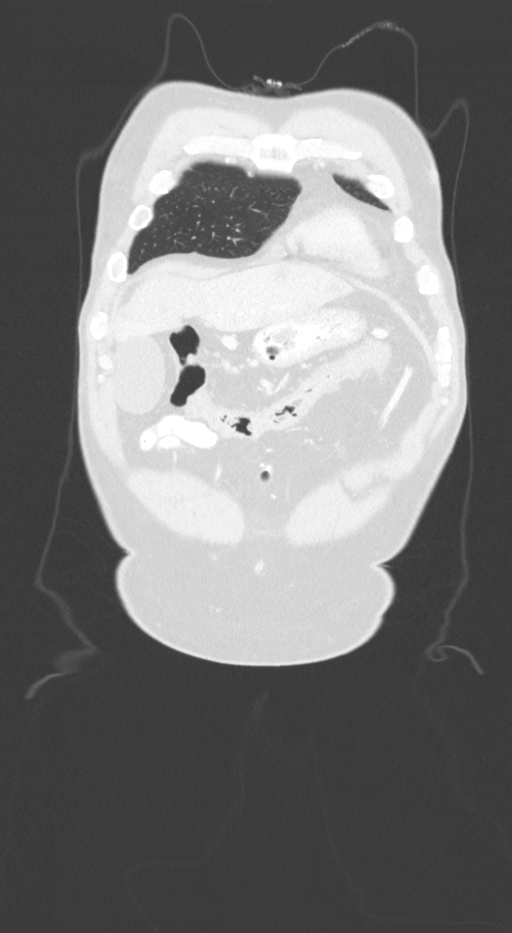
[im 58/144  lung]
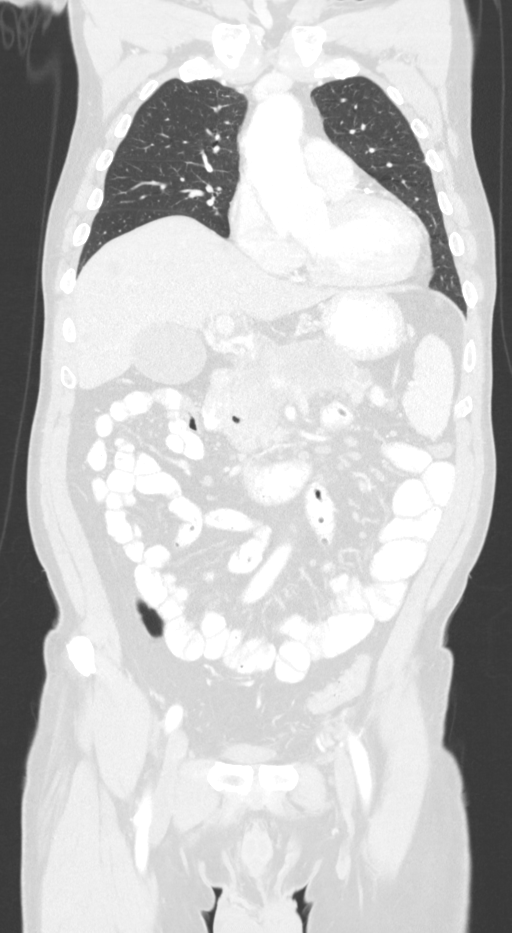
[im 86/144  lung]
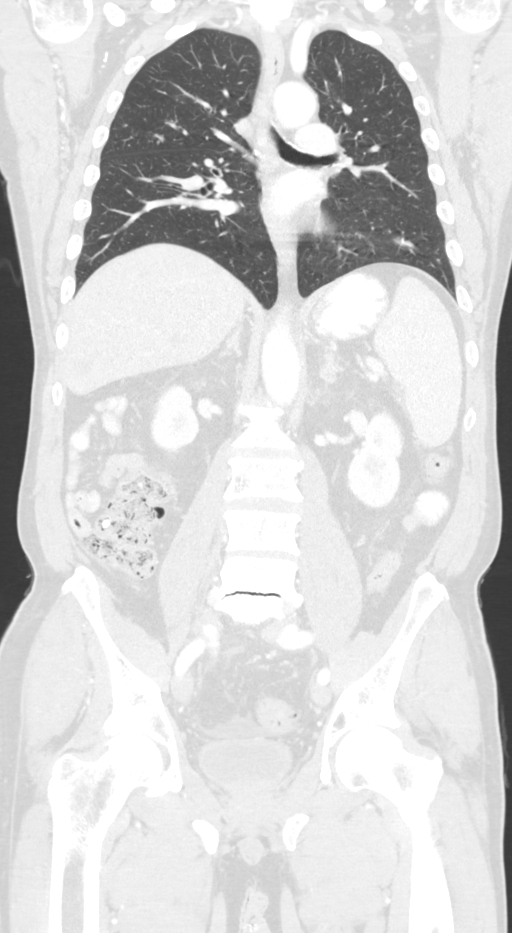

[12 of 36 positions shown; findings below may reference images not displayed]

FINDINGS: CT CHEST FINDINGS

Mediastinum/Nodes: The heart is normal in size. No pericardial
effusion.

Coronary atherosclerosis in the LAD and right coronary artery.

Mild ectasia of the ascending thoracic aorta, measuring 3.9 cm
(series 2/image 29).

Calcified mediastinal and right hilar lymph nodes, including a
dominant 2.1 cm low right paratracheal node (series 2/image 20).

Visualized thyroid is unremarkable.

Lungs/Pleura: Posterior calcified granulomata in the posterior right
upper lobe, in a segmental distribution (series 3/ images 8578),
benign.

Additional scattered noncalcified bilateral pulmonary nodules
measuring up to 7 mm (series 3/ images 10, 12, 13, 17, 18, 24, and
35), indeterminate, but worrisome for metastatic disease.

No pleural effusion or pneumothorax.

Musculoskeletal: Degenerative changes of the thoracic spine.

CT ABDOMEN PELVIS FINDINGS

Hepatobiliary: Multifocal hepatic metastases. Index lesions include
a 2.5 cm lesion in segment 2 (series 2/ image 41) and a 1.9 cm
lesion in segment 8 (series 2/ image 41).

Gallbladder is within normal limits. No intrahepatic or extrahepatic
ductal dilatation.

Pancreas: 5.4 x 9.0 cm hypoenhancing mass involving the pancreatic
head, body, and proximal tail, compatible with primary pancreatic
adenocarcinoma (series 2/ image 54).

Mass abuts the celiac axis and encases the common hepatic artery
(series 2/ image 51). Mass encases the portal vein/SMV and likely
occludes the splenic vein.

Adjacent small peripancreatic/mesenteric lymph nodes measuring up to
7-8 mm short axis (series 2/ images 58 and 60).

Spleen: Within normal limits.

Adrenals/Urinary Tract: Adrenal glands are within normal limits.

Kidneys are within normal limits.  No hydronephrosis.

Bladder is mildly thick-walled although underdistended

Stomach/Bowel: Stomach is within normal limits.

No evidence of bowel obstruction.

Left colon is underdistended.

Vascular/Lymphatic: Atherosclerotic calcifications of the abdominal
aorta and branch vessels.

No evidence of abdominal aortic aneurysm.

Vascular involvement as above.

Small peripancreatic/mesenteric lymph nodes, as above. Additional 10
mm short axis left celiac axis node (series 2/image 58).

Reproductive: Prostate is unremarkable.

Other: Trace right pelvic ascites (series 2/ image 102).

Musculoskeletal: Grade 2 spondylolisthesis at L4-5.

Degenerative changes of the lumbar spine.
IMPRESSION: 9.0 cm pancreatic mass, compatible with primary pancreatic
adenocarcinoma.

Associated vascular involvement, as above.

Adjacent small peripancreatic, mesenteric, and celiac axis lymph
nodes.

Multifocal hepatic metastases, with index lesions as above.

Small bilateral pulmonary nodules measuring up to 7 mm, nonspecific
but suspicious for metastases.

Additional ancillary findings as above.

## 2017-10-30 IMAGING — US US ABDOMEN COMPLETE
1 series · 14 of 25 positions shown · non-contrast
Comparison: No prior.

CLINICAL DATA: Abdominal pain.

EXAM:
ABDOMEN ULTRASOUND COMPLETE

[Series 1: us abdomen complete · 0.18mm/px · 14 of 66 slices shown]
[im 1/66]
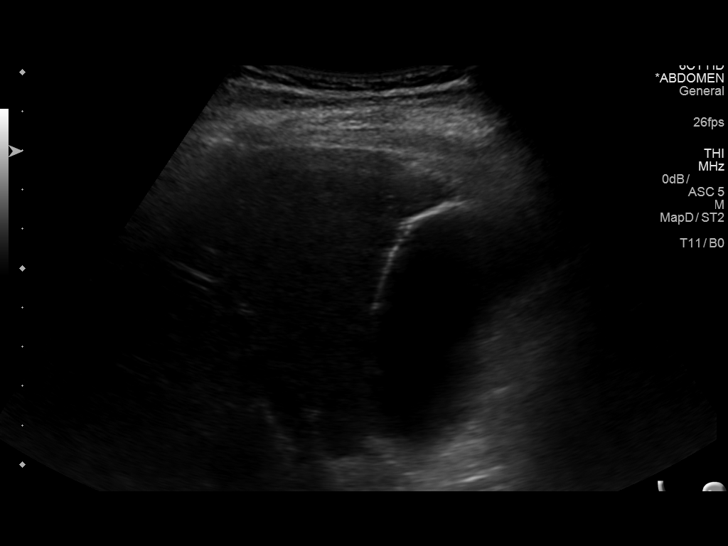
[im 6/66]
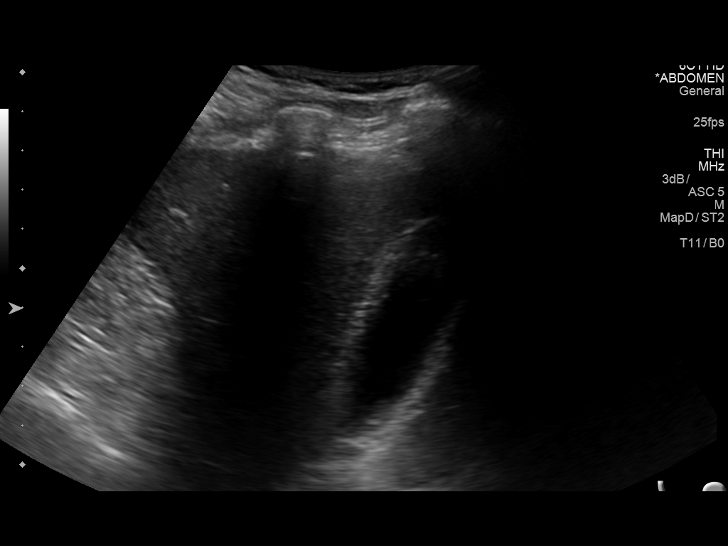
[im 11/66]
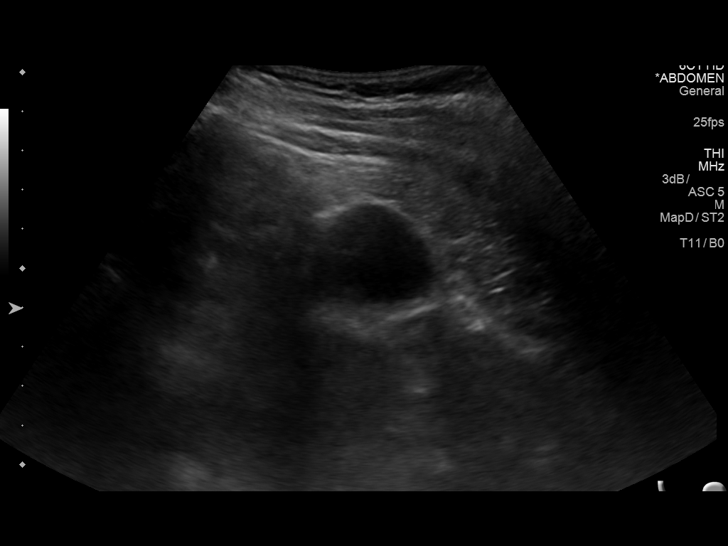
[im 17/66]
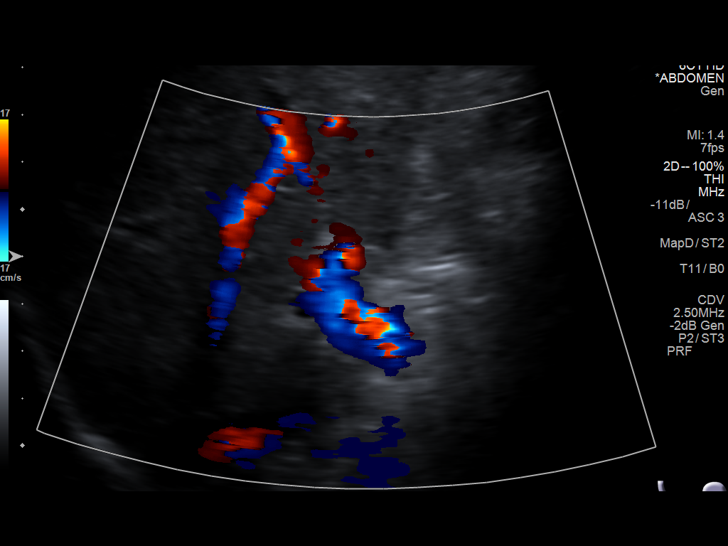
[im 22/66]
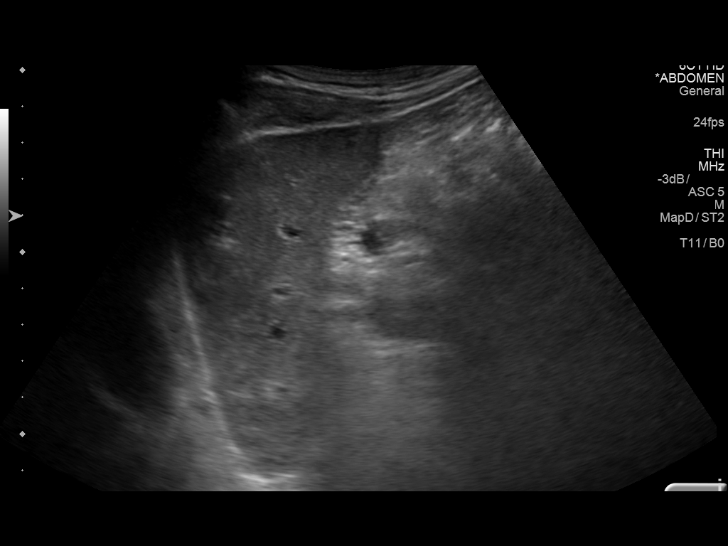
[im 25/66]
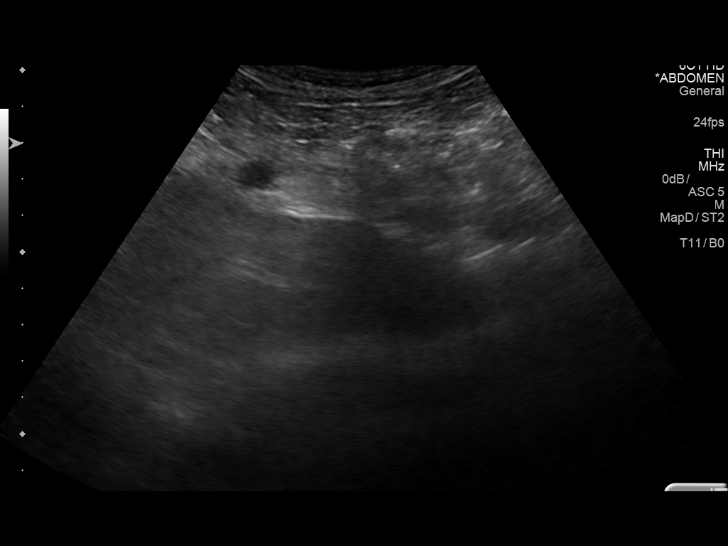
[im 30/66]
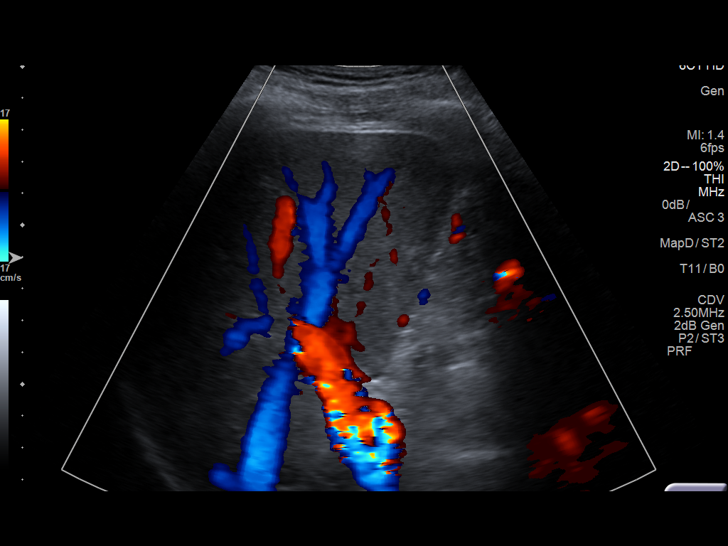
[im 36/66]
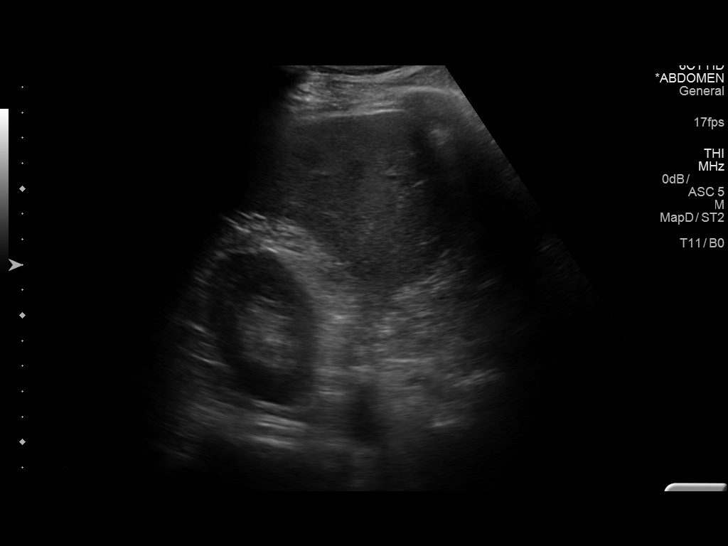
[im 41/66]
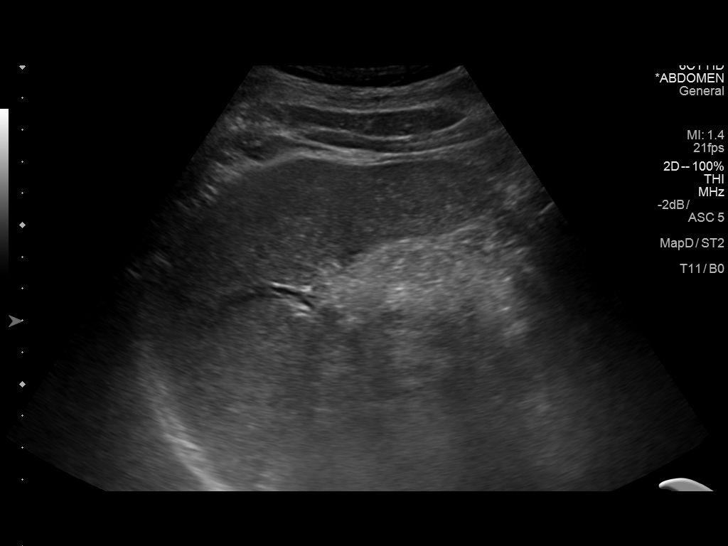
[im 44/66]
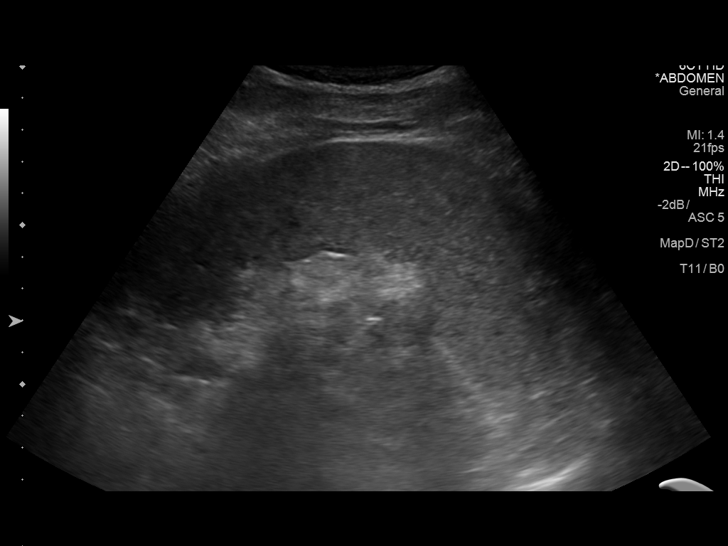
[im 49/66]
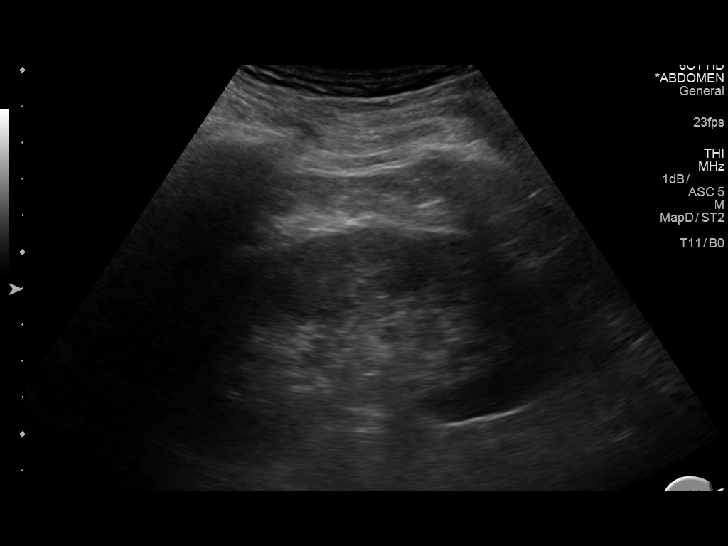
[im 55/66]
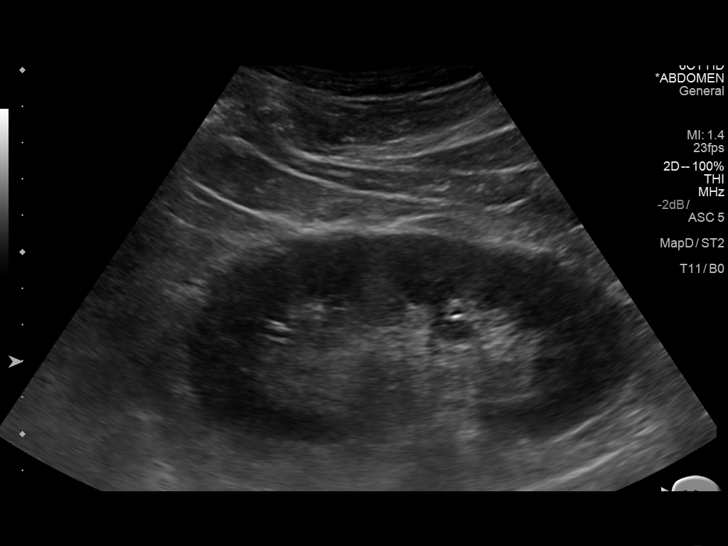
[im 60/66]
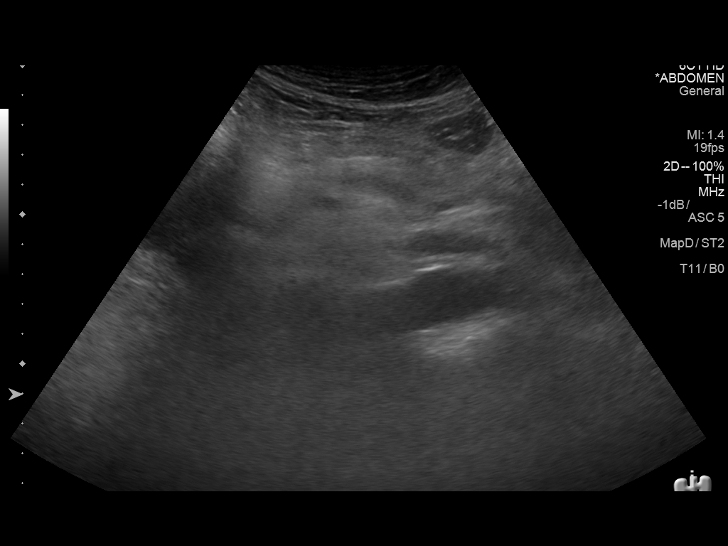
[im 66/66]
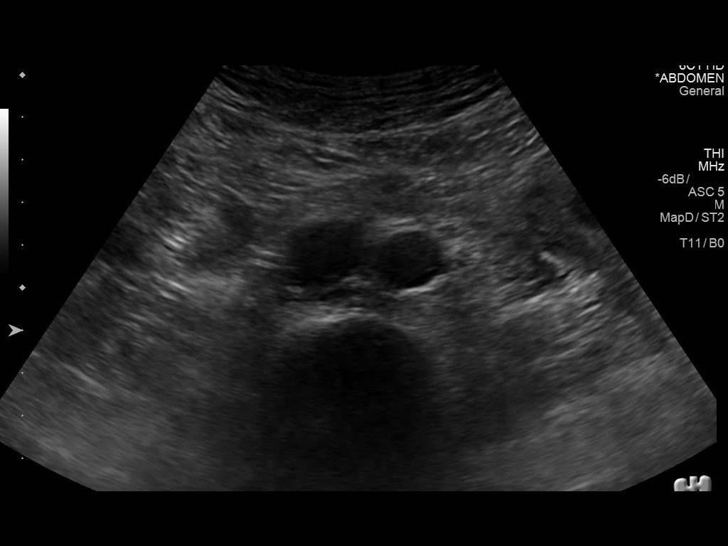

[14 of 25 positions shown; findings below may reference images not displayed]

FINDINGS: Gallbladder: No gallstones or wall thickening visualized. No
sonographic Murphy sign noted by sonographer.

Common bile duct: Diameter: 3.2 mm

Liver: No focal lesion identified. Within normal limits in
parenchymal echogenicity.

IVC: No abnormality visualized.

Pancreas: Visualized portion unremarkable.

Spleen: Size and appearance within normal limits.

Right Kidney: Length: 11.6 cm. Echogenicity within normal limits. No
mass or hydronephrosis visualized.

Left Kidney: Length: 12.5 cm. Echogenicity within normal limits. No
mass or hydronephrosis visualized.

Abdominal aorta: No aneurysm visualized.

Other findings: Limited exam due to body habitus and overlying bowel
gas.
IMPRESSION: No acute abnormality exam. Exam limited due to patient's body
habitus and overlying bowel gas.
